# Patient Record
Sex: Male | Born: 1985 | Hispanic: Yes | Marital: Single | Smoking: Current some day smoker
Health system: Southern US, Community
[De-identification: ages and names within clinical notes are randomized; demographics above are authoritative.]

## PROBLEM LIST (undated history)

## (undated) DIAGNOSIS — Z8489 Family history of other specified conditions: Secondary | ICD-10-CM

## (undated) HISTORY — PX: APPENDECTOMY: SHX54

---

## 2019-03-17 ENCOUNTER — Other Ambulatory Visit: Payer: Self-pay

## 2019-03-17 ENCOUNTER — Emergency Department (HOSPITAL_COMMUNITY): Admission: EM | Admit: 2019-03-17 | Discharge: 2019-03-17 | Discharge status: Against Medical Advice | Payer: Self-pay

## 2019-03-17 NOTE — ED Notes (Signed)
Called for triage with no answer x1 

## 2019-03-17 NOTE — ED Notes (Signed)
Unable to locate patient multiple times by staff.  

## 2019-03-17 NOTE — ED Notes (Signed)
No answer x2 

## 2019-03-18 ENCOUNTER — Emergency Department (HOSPITAL_COMMUNITY): Payer: Self-pay

## 2019-03-18 ENCOUNTER — Emergency Department (HOSPITAL_COMMUNITY)
Admission: EM | Admit: 2019-03-18 | Discharge: 2019-03-18 | Disposition: A | Discharge status: Home / Self Care | Payer: Self-pay | Attending: Emergency Medicine | Admitting: Emergency Medicine

## 2019-03-18 ENCOUNTER — Encounter (HOSPITAL_COMMUNITY): Payer: Self-pay | Admitting: Emergency Medicine

## 2019-03-18 DIAGNOSIS — R509 Fever, unspecified: Secondary | ICD-10-CM | POA: Insufficient documentation

## 2019-03-18 DIAGNOSIS — U071 COVID-19: Secondary | ICD-10-CM | POA: Insufficient documentation

## 2019-03-18 LAB — CBC
HCT: 43.2 % (ref 39.0–52.0)
Hemoglobin: 14.4 g/dL (ref 13.0–17.0)
MCH: 29.8 pg (ref 26.0–34.0)
MCHC: 33.3 g/dL (ref 30.0–36.0)
MCV: 89.3 fL (ref 80.0–100.0)
Platelets: 148 10*3/uL — ABNORMAL LOW (ref 150–400)
RBC: 4.84 MIL/uL (ref 4.22–5.81)
RDW: 11.8 % (ref 11.5–15.5)
WBC: 4 10*3/uL (ref 4.0–10.5)
nRBC: 0 % (ref 0.0–0.2)

## 2019-03-18 LAB — BASIC METABOLIC PANEL
Anion gap: 13 (ref 5–15)
BUN: 6 mg/dL (ref 6–20)
CO2: 24 mmol/L (ref 22–32)
Calcium: 8.7 mg/dL — ABNORMAL LOW (ref 8.9–10.3)
Chloride: 98 mmol/L (ref 98–111)
Creatinine, Ser: 0.91 mg/dL (ref 0.61–1.24)
GFR calc Af Amer: >60 mL/min (ref >60)
GFR calc non Af Amer: >60 mL/min (ref >60)
Glucose, Bld: 146 mg/dL — ABNORMAL HIGH (ref 70–99)
Potassium: 3.9 mmol/L (ref 3.5–5.1)
Sodium: 135 mmol/L (ref 135–145)

## 2019-03-18 LAB — URINALYSIS, ROUTINE W REFLEX MICROSCOPIC
Bilirubin Urine: NEGATIVE
Glucose, UA: NEGATIVE mg/dL
Hgb urine dipstick: NEGATIVE
Ketones, ur: NEGATIVE mg/dL
Leukocytes,Ua: NEGATIVE
Nitrite: NEGATIVE
Protein, ur: NEGATIVE mg/dL
Specific Gravity, Urine: 1.003 — ABNORMAL LOW (ref 1.005–1.030)
pH: 7 (ref 5.0–8.0)

## 2019-03-18 MED ORDER — ONDANSETRON HCL 4 MG/2ML IJ SOLN
4.0000 mg | Freq: Once | INTRAMUSCULAR | Status: AC
  Administered 2019-03-18: 17:00:00 4 mg via INTRAVENOUS
  Filled 2019-03-18: qty 2

## 2019-03-18 MED ORDER — SODIUM CHLORIDE 0.9 % IV BOLUS
1000.0000 mL | Freq: Once | INTRAVENOUS | Status: AC
  Administered 2019-03-18: 1000 mL via INTRAVENOUS

## 2019-03-18 MED ORDER — KETOROLAC TROMETHAMINE 30 MG/ML IJ SOLN
30.0000 mg | Freq: Once | INTRAMUSCULAR | Status: AC
  Administered 2019-03-18: 30 mg via INTRAVENOUS
  Filled 2019-03-18: qty 1

## 2019-03-18 MED ORDER — ACETAMINOPHEN 500 MG PO TABS
1000.0000 mg | ORAL_TABLET | Freq: Once | ORAL | Status: AC
  Administered 2019-03-18: 1000 mg via ORAL
  Filled 2019-03-18: qty 2

## 2019-03-18 NOTE — ED Triage Notes (Signed)
Pt states he was here last night but the wait was too long. Endorses 2 weeks of generalized pain, chills and night sweats. Has been working outside for 2 works and not drinking water so states he may be dehydrated.

## 2019-03-18 NOTE — ED Notes (Signed)
Patient verbalizes understanding of discharge instructions . Opportunity for questions and answers were provided . Armband removed by staff ,Pt discharged from ED. W/C  offered at D/C  and Declined W/C at D/C and was escorted to lobby by RN.  

## 2019-03-18 NOTE — ED Provider Notes (Signed)
[facility] EMERGENCY DEPARTMENT Provider Note   CSN: [id number] Arrival date & time: 03/18/19  1303     History   Chief Complaint Chief Complaint  Patient presents with  . Generalized Body Aches    HPI Peter Beck is a 33 y.o. male who presents for evaluation of generalized body aches, myalgias, fever/chills, loss of appetite that has been ongoing for the last 2 weeks.  He states initially about 2 weeks ago, he felt like his tongue "felt weird." He states he had some numbness in his tongue and felt like he was not tasting his food.  He states that shortly after, he started developing subjective fever chills.  He states that he feels "achy all over."  He states that he has not had much of an appetite.  He also reports he has had some congestion, rhinorrhea.  He also reports some cough that is productive for sputum.  He did have one episode of vomiting today but otherwise has not had any vomiting.  He denies any difficulty breathing, chest pain, abdominal pain, urinary complaints, hematuria.  He does report that about a week and a half ago, he had contact with a person who was then found to be Covid positive.  No other known COVID-19 exposures.   The history is provided by the patient. No language interpreter was used (I offered patient language interpreter but wife stated that she would translate.).    History reviewed. No pertinent past medical history.  There are no active problems to display for this patient.   History reviewed. No pertinent surgical history.      Home Medications    Prior to Admission medications   Not on File    Family History No family history on file.  Social History Social History   Tobacco Use  . Smoking status: Not on file  Substance Use Topics  . Alcohol use: Not on file  . Drug use: Not on file     Allergies   Patient has no allergy information on record.   Review of Systems Review of Systems  Constitutional:  Positive for appetite change, chills and fever. Negative for diaphoresis.  Respiratory: Positive for cough. Negative for shortness of breath.   Cardiovascular: Negative for chest pain.  Gastrointestinal: Negative for abdominal pain, nausea and vomiting.  Genitourinary: Negative for dysuria and hematuria.  Musculoskeletal: Positive for myalgias.  Neurological: Negative for headaches.  All other systems reviewed and are negative.    Physical Exam Updated Vital Signs BP 104/62   Pulse 90   Temp 98.7 F (37.1 C) (Oral)   Resp 19   SpO2 96%   Physical Exam Vitals signs and nursing note reviewed.  Constitutional:      Appearance: Normal appearance. He is well-developed.  HENT:     Head: Normocephalic and atraumatic.     Mouth/Throat:     Comments: Posterior oropharynx is without any erythema, edema.  Uvula is midline.  Airways patent, phonation is intact. Eyes:     General: Lids are normal.     Conjunctiva/sclera: Conjunctivae normal.     Pupils: Pupils are equal, round, and reactive to light.  Neck:     Musculoskeletal: Full passive range of motion without pain.  Cardiovascular:     Rate and Rhythm: Normal rate and regular rhythm.     Pulses: Normal pulses.     Heart sounds: Normal heart sounds. No murmur. No friction rub. No gallop.   Pulmonary:  Effort: Pulmonary effort is normal.     Breath sounds: Normal breath sounds.     Comments: No evidence of respiratory distress.  Able to speak in full sentences without any difficulty. Abdominal:     Palpations: Abdomen is soft. Abdomen is not rigid.     Tenderness: There is no abdominal tenderness. There is no guarding.     Comments: Abdomen is soft, non-distended, non-tender. No rigidity, No guarding. No peritoneal signs.   Musculoskeletal: Normal range of motion.  Skin:    General: Skin is warm and dry.     Capillary Refill: Capillary refill takes less than 2 seconds.  Neurological:     Mental Status: He is alert and  oriented to person, place, and time.  Psychiatric:        Speech: Speech normal.      ED Treatments / Results  Labs (all labs ordered are listed, but only abnormal results are displayed) Labs Reviewed  BASIC METABOLIC PANEL - Abnormal; Notable for the following components:      Result Value   Glucose, Bld 146 (*)    Calcium 8.7 (*)    All other components within normal limits  CBC - Abnormal; Notable for the following components:   Platelets 148 (*)    All other components within normal limits  URINALYSIS, ROUTINE W REFLEX MICROSCOPIC - Abnormal; Notable for the following components:   Color, Urine STRAW (*)    Specific Gravity, Urine 1.003 (*)    All other components within normal limits  NOVEL CORONAVIRUS, NAA (HOSP ORDER, SEND-OUT TO REF LAB; TAT 18-24 HRS)    EKG None  Radiology Dg Chest Portable 1 View  Result Date: 03/18/2019 CLINICAL DATA:  New onset chest pain and shortness of breath EXAM: PORTABLE CHEST 1 VIEW COMPARISON:  None. FINDINGS: The heart size and mediastinal contours are within normal limits. Both lungs are clear. The visualized skeletal structures are unremarkable. IMPRESSION: No active disease. Electronically Signed   By: Abelardo Diesel M.D.   On: 03/18/2019 16:40    Procedures Procedures (including critical care time)  Medications Ordered in ED Medications  ondansetron (ZOFRAN) injection 4 mg (4 mg Intravenous Given 03/18/19 1656)  sodium chloride 0.9 % bolus 1,000 mL (0 mLs Intravenous Stopped 03/18/19 1917)  ketorolac (TORADOL) 30 MG/ML injection 30 mg (30 mg Intravenous Given 03/18/19 1800)  acetaminophen (TYLENOL) tablet 1,000 mg (1,000 mg Oral Given 03/18/19 1930)  sodium chloride 0.9 % bolus 1,000 mL (0 mLs Intravenous Stopped 03/18/19 2140)     Initial Impression / Assessment and Plan / ED Course  I have reviewed the triage vital signs and the nursing notes.  Pertinent labs & imaging results that were available during my care of the  patient were reviewed by me and considered in my medical decision making (see chart for details).        33 year old male who presents for evaluation of 1 week of generalized myalgias, fever/chills.  He reports that 2 weeks ago, he had some episodes of losing his taste as well as feeling that his tongue was numb.  He says numbness but when he describes to me it sounds more like he could not taste rather than his tongue being numb.  That has since gotten better but then he developed fevers, chills, generalized body aches.  On initial arrival, he was afebrile.  Vitals otherwise stable.  His repeat temperature had improved.   Consider viral process such as COVID-19.  His abdominal exam is benign  with no evidence of tenderness.  History/physical exam not concerning for appendicitis, diverticulitis, kidney stone.  Lungs clear to auscultation.  Doubt pneumonia.  We will plan to check basic labs, urine, chest x-ray to ensure no other acute abnormality.  X-ray negative for any acute effects etiology.  UA negative for any infectious etiology.  BMP is unremarkable.  CBC without any significant leukocytosis or anemia.  New.  Repeat evaluation.  His fever has returned.  He was not given any Tylenol initial ED arrival.  Patient given Tylenol, fluids.  Patient is walking the ED without difficulty.  He reports feeling better.  Repeat temperature is 98.7.  Vitals otherwise stable.  Patient states he is ready to go home.  Patient instructed to quarantine himself until his Covid test comes back. At this time, patient exhibits no emergent life-threatening condition that require further evaluation in ED or admission. Patient had ample opportunity for questions and discussion. All patient's questions were answered with full understanding. Strict return precautions discussed. Patient expresses understanding and agreement to plan.   Peter Beck was evaluated in Emergency Department on 03/18/2019 for the symptoms described in  the history of present illness. He was evaluated in the context of the global COVID-19 pandemic, which necessitated consideration that the patient might be at risk for infection with the SARS-CoV-2 virus that causes COVID-19. Institutional protocols and algorithms that pertain to the evaluation of patients at risk for COVID-19 are in a state of rapid change based on information released by regulatory bodies including the CDC and federal and state organizations. These policies and algorithms were followed during the patient's care in the ED.  Portions of this note were generated with Scientist, clinical (histocompatibility and immunogenetics)Dragon dictation software. Dictation errors may occur despite best attempts at proofreading.    Final Clinical Impressions(s) / ED Diagnoses   Final diagnoses:  Fever, unspecified fever cause    ED Discharge Orders    None       Rosana HoesLayden, Lindsey A, PA-C 03/18/19 2211    Tegeler, Canary Brimhristopher J, MD 03/19/19 [phone]

## 2019-03-18 NOTE — Discharge Instructions (Signed)
You have been tested for COVID.  Your test will come back in about 24 to 48 hours.  You should quarantine until the test comes back.  If you are positive, you will need to additional quarantine for 2 more weeks.  Make sure you are staying hydrated and drinking plenty of fluids.  Return to emergency department for any trouble breathing, vomiting, inability eat or drink or any other worsening or concerning symptoms.      Person Under Monitoring Name: Peter Beck  Location: [address] [place] [address]   Infection Prevention Recommendations for Individuals Confirmed to have, or Being Evaluated for, 2019 Novel Coronavirus (COVID-19) Infection Who Receive Care at Home  Individuals who are confirmed to have, or are being evaluated for, COVID-19 should follow the prevention steps below until a healthcare provider or local or state health department says they can return to normal activities.  Stay home except to get medical care You should restrict activities outside your home, except for getting medical care. Do not go to work, school, or public areas, and do not use public transportation or taxis.  Call ahead before visiting your doctor Before your medical appointment, call the healthcare provider and tell them that you have, or are being evaluated for, COVID-19 infection. This will help the healthcare providers office take steps to keep other people from getting infected. Ask your healthcare provider to call the local or state health department.  Monitor your symptoms Seek prompt medical attention if your illness is worsening (e.g., difficulty breathing). Before going to your medical appointment, call the healthcare provider and tell them that you have, or are being evaluated for, COVID-19 infection. Ask your healthcare provider to call the local or state health department.  Wear a facemask You should wear a facemask that covers your nose and mouth when you are in the  same room with other people and when you visit a healthcare provider. People who live with or visit you should also wear a facemask while they are in the same room with you.  Separate yourself from other people in your home As much as possible, you should stay in a different room from other people in your home. Also, you should use a separate bathroom, if available.  Avoid sharing household items You should not share dishes, drinking glasses, cups, eating utensils, towels, bedding, or other items with other people in your home. After using these items, you should wash them thoroughly with soap and water.  Cover your coughs and sneezes Cover your mouth and nose with a tissue when you cough or sneeze, or you can cough or sneeze into your sleeve. Throw used tissues in a lined trash can, and immediately wash your hands with soap and water for at least 20 seconds or use an alcohol-based hand rub.  Wash your [facility] your hands often and thoroughly with soap and water for at least 20 seconds. You can use an alcohol-based hand sanitizer if soap and water are not available and if your hands are not visibly dirty. Avoid touching your eyes, nose, and mouth with unwashed hands.   Prevention Steps for Caregivers and Household Members of Individuals Confirmed to have, or Being Evaluated for, COVID-19 Infection Being Cared for in the Home  If you live with, or provide care at home for, a person confirmed to have, or being evaluated for, COVID-19 infection please follow these guidelines to prevent infection:  Follow healthcare providers instructions Make sure that you understand  and can help the patient follow any healthcare provider instructions for all care.  Provide for the patients basic needs You should help the patient with basic needs in the home and provide support for getting groceries, prescriptions, and other personal needs.  Monitor the patients symptoms If they are getting  sicker, call his or her medical provider and tell them that the patient has, or is being evaluated for, COVID-19 infection. This will help the healthcare providers office take steps to keep other people from getting infected. Ask the healthcare provider to call the local or state health department.  Limit the number of people who have contact with the patient  If possible, have only one caregiver for the patient.  Other household members should stay in another home or place of residence. If this is not possible, they should stay  in another room, or be separated from the patient as much as possible. Use a separate bathroom, if available.  Restrict visitors who do not have an essential need to be in the home.  Keep older adults, very young children, and other sick people away from the patient Keep older adults, very young children, and those who have compromised immune systems or chronic health conditions away from the patient. This includes people with chronic heart, lung, or kidney conditions, diabetes, and cancer.  Ensure good ventilation Make sure that shared spaces in the home have good air flow, such as from an air conditioner or an opened window, weather permitting.  Wash your hands often  Wash your hands often and thoroughly with soap and water for at least 20 seconds. You can use an alcohol based hand sanitizer if soap and water are not available and if your hands are not visibly dirty.  Avoid touching your eyes, nose, and mouth with unwashed hands.  Use disposable paper towels to dry your hands. If not available, use dedicated cloth towels and replace them when they become wet.  Wear a facemask and gloves  Wear a disposable facemask at all times in the room and gloves when you touch or have contact with the patients blood, body fluids, and/or secretions or excretions, such as sweat, saliva, sputum, nasal mucus, vomit, urine, or feces.  Ensure the mask fits over your nose and  mouth tightly, and do not touch it during use.  Throw out disposable facemasks and gloves after using them. Do not reuse.  Wash your hands immediately after removing your facemask and gloves.  If your personal clothing becomes contaminated, carefully remove clothing and launder. Wash your hands after handling contaminated clothing.  Place all used disposable facemasks, gloves, and other waste in a lined container before disposing them with other household waste.  Remove gloves and wash your hands immediately after handling these items.  Do not share dishes, glasses, or other household items with the patient  Avoid sharing household items. You should not share dishes, drinking glasses, cups, eating utensils, towels, bedding, or other items with a patient who is confirmed to have, or being evaluated for, COVID-19 infection.  After the person uses these items, you should wash them thoroughly with soap and water.  Wash laundry thoroughly  Immediately remove and wash clothes or bedding that have blood, body fluids, and/or secretions or excretions, such as sweat, saliva, sputum, nasal mucus, vomit, urine, or feces, on them.  Wear gloves when handling laundry from the patient.  Read and follow directions on labels of laundry or clothing items and detergent. In general, wash and dry  with the warmest temperatures recommended on the label.  Clean all areas the individual has used often  Clean all touchable surfaces, such as counters, tabletops, doorknobs, bathroom fixtures, toilets, phones, keyboards, tablets, and bedside tables, every day. Also, clean any surfaces that may have blood, body fluids, and/or secretions or excretions on them.  Wear gloves when cleaning surfaces the patient has come in contact with.  Use a diluted bleach solution (e.g., dilute bleach with 1 part bleach and 10 parts water) or a household disinfectant with a label that says EPA-registered for coronaviruses. To make a  bleach solution at home, add 1 tablespoon of bleach to 1 quart (4 cups) of water. For a larger supply, add  cup of bleach to 1 gallon (16 cups) of water.  Read labels of cleaning products and follow recommendations provided on product labels. Labels contain instructions for safe and effective use of the cleaning product including precautions you should take when applying the product, such as wearing gloves or eye protection and making sure you have good ventilation during use of the product.  Remove gloves and wash hands immediately after cleaning.  Monitor yourself for signs and symptoms of illness Caregivers and household members are considered close contacts, should monitor their health, and will be asked to limit movement outside of the home to the extent possible. Follow the monitoring steps for close contacts listed on the symptom monitoring form.   ? If you have additional questions, contact your local health department or call the epidemiologist on call at [phone] (available 24/7). ? This guidance is subject to change. For the most up-to-date guidance from [facility], please refer to their website: TripMetro.huhttps://www.cdc.gov/coronavirus/2019-ncov/hcp/guidance-prevent-spread.html

## 2019-03-23 LAB — NOVEL CORONAVIRUS, NAA (HOSP ORDER, SEND-OUT TO REF LAB; TAT 18-24 HRS): SARS-CoV-2, NAA: DETECTED — AB

## 2019-08-11 ENCOUNTER — Emergency Department (HOSPITAL_BASED_OUTPATIENT_CLINIC_OR_DEPARTMENT_OTHER)
Admission: EM | Admit: 2019-08-11 | Discharge: 2019-08-11 | Disposition: A | Discharge status: Home / Self Care | Payer: Self-pay | Attending: Emergency Medicine | Admitting: Emergency Medicine

## 2019-08-11 ENCOUNTER — Encounter (HOSPITAL_BASED_OUTPATIENT_CLINIC_OR_DEPARTMENT_OTHER): Payer: Self-pay | Admitting: *Deleted

## 2019-08-11 ENCOUNTER — Other Ambulatory Visit: Payer: Self-pay

## 2019-08-11 ENCOUNTER — Emergency Department (HOSPITAL_BASED_OUTPATIENT_CLINIC_OR_DEPARTMENT_OTHER): Payer: Self-pay

## 2019-08-11 DIAGNOSIS — R2242 Localized swelling, mass and lump, left lower limb: Secondary | ICD-10-CM | POA: Insufficient documentation

## 2019-08-11 DIAGNOSIS — F17210 Nicotine dependence, cigarettes, uncomplicated: Secondary | ICD-10-CM | POA: Insufficient documentation

## 2019-08-11 DIAGNOSIS — M25469 Effusion, unspecified knee: Secondary | ICD-10-CM

## 2019-08-11 DIAGNOSIS — W19XXXA Unspecified fall, initial encounter: Secondary | ICD-10-CM

## 2019-08-11 MED ORDER — HYDROCODONE-ACETAMINOPHEN 5-325 MG PO TABS
1.0000 | ORAL_TABLET | Freq: Once | ORAL | Status: DC
  Filled 2019-08-11: qty 1

## 2019-08-11 MED ORDER — HYDROCODONE-ACETAMINOPHEN 5-325 MG PO TABS: 1.0000 | ORAL_TABLET | Freq: Four times a day (QID) | ORAL | 0 refills | Status: AC | PRN

## 2019-08-11 NOTE — ED Notes (Signed)
Interpreter # [phone] was used.

## 2019-08-11 NOTE — ED Triage Notes (Signed)
Left lower leg pain and injury. He was working on his house and fell off the first floor and landed on his feet.

## 2019-08-11 NOTE — ED Provider Notes (Signed)
[facility] EMERGENCY DEPARTMENT Provider Note   CSN: [id number] Arrival date & time: 08/11/19  2053     History Chief Complaint  Patient presents with  . Leg Pain    Peter Beck is a 34 y.o. male.  Patient is a 34 year old male with no past medical history presenting to the emergency department for left knee injury.  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  Reports that he landed on his left leg.  Reports pain and swelling to the left knee.  Did not hit his head or pass out.  No other injuries.        History reviewed. No pertinent past medical history.  There are no problems to display for this patient.   Past Surgical History:  Procedure Laterality Date  . APPENDECTOMY         No family history on file.  Social History   Tobacco Use  . Smoking status: Current Every Day Smoker  . Smokeless tobacco: Never Used  Substance Use Topics  . Alcohol use: Yes  . Drug use: Never    Home Medications Prior to Admission medications   Medication Sig Start Date End Date Taking? Authorizing Provider  HYDROcodone-acetaminophen (NORCO/VICODIN) 5-325 MG tablet Take 1 tablet by mouth every 6 (six) hours as needed for up to 2 days for severe pain. 08/11/19 08/13/19  Arlyn Dunning, PA-C    Allergies    Patient has no known allergies.  Review of Systems   Review of Systems  Musculoskeletal: Positive for arthralgias, gait problem and joint swelling. Negative for back pain and neck pain.  Neurological: Negative for dizziness, syncope, weakness and headaches.    Physical Exam Updated Vital Signs BP (!) 135/92   Pulse 99   Temp 98.2 F (36.8 C) (Oral)   Resp 20   Ht 5\' 7"  (1.702 m)   Wt 80.7 kg   SpO2 99%   BMI 27.88 kg/m   Physical Exam Vitals and nursing note reviewed.  Constitutional:      General: He is not in acute distress.    Appearance: Normal appearance. He is not ill-appearing,  toxic-appearing or diaphoretic.  HENT:     Head: Normocephalic.  Eyes:     Conjunctiva/sclera: Conjunctivae normal.  Pulmonary:     Effort: Pulmonary effort is normal.  Musculoskeletal:     Left knee: Swelling and effusion present. No deformity. Decreased range of motion. Tenderness present.     Comments: No spinal muscular or bony tenderness.  Medial left knee swelling with tenderness to palpation and ecchymosis.  Ecchymosis over the left mid tibia.  Normal left ankle and foot.  Skin:    General: Skin is dry.  Neurological:     Mental Status: He is alert.  Psychiatric:        Mood and Affect: Mood normal.     ED Results / Procedures / Treatments   Labs (all labs ordered are listed, but only abnormal results are displayed) Labs Reviewed - No data to display  EKG None  Radiology DG Tibia/Fibula Left  Result Date: 08/11/2019 CLINICAL DATA:  Initial evaluation for acute trauma, fall. EXAM: LEFT TIBIA AND FIBULA - 2 VIEW COMPARISON:  None. FINDINGS: There is no evidence of fracture or other focal bone lesions. Soft tissues are unremarkable. IMPRESSION: Negative. Electronically Signed   By: 08/13/2019 M.D.   On: 08/11/2019 21:35   DG Knee Complete 4 Views Left  Result Date: 08/11/2019 CLINICAL DATA:  Initial evaluation for acute trauma, fall. EXAM: LEFT KNEE - COMPLETE 4+ VIEW COMPARISON:  None. FINDINGS: No acute fracture dislocation. Small joint effusion noted within the suprapatellar recess. 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 Osseous mineralization normal. No soft tissue injury. IMPRESSION: 1. No acute osseous abnormality. 2. Small joint effusion within the suprapatellar recess. Electronically Signed   By: Jeannine Boga M.D.   On: 08/11/2019 21:34    Procedures Procedures (including critical care time)  Medications Ordered in ED Medications  HYDROcodone-acetaminophen (NORCO/VICODIN) 5-325 MG per tablet 1 tablet (1 tablet Oral Refused  08/11/19 2203)    ED Course  I have reviewed the triage vital signs and the nursing notes.  Pertinent labs & imaging results that were available during my care of the patient were reviewed by me and considered in my medical decision making (see chart for details).  Clinical Course as of Aug 11 2226  Mon Aug 11, 2019  2148 Patient with mechanical fall from first story.  Has significant pain and swelling to the left knee   [KM]  2228 Patient with fall and left knee pain.  No other injuries.  Significant pain and swelling of the medial left knee on exam.  X-ray are reassuring.  Suspect ligamentous issue so patient was placed in knee immobilizer and on crutches and advised close follow-up with orthopedics.   [KM]    Clinical Course User Index [KM] Kristine Royal   MDM Rules/Calculators/A&P                     Based on review of vitals, medical screening exam, lab work and/or imaging, there does not appear to be an acute, emergent etiology for the patient's symptoms. Counseled pt on good return precautions and encouraged both PCP and ED follow-up as needed.  Prior to discharge, I also discussed incidental imaging findings with patient in detail and advised appropriate, recommended follow-up in detail.  Clinical Impression: 1. Fall, initial encounter   2. Knee swelling     Disposition: Discharge  Prior to providing a prescription for a controlled substance, I independently reviewed the patient's recent prescription history on the [facility]. The patient had no recent or regular prescriptions and was deemed appropriate for a brief, less than 3 day prescription of narcotic for acute analgesia.  This note was prepared with assistance of Systems analyst. Occasional wrong-word or sound-a-like substitutions may have occurred due to the inherent limitations of voice recognition software.  Final Clinical Impression(s) / ED  Diagnoses Final diagnoses:  Fall, initial encounter  Knee swelling    Rx / DC Orders ED Discharge Orders         Ordered    HYDROcodone-acetaminophen (NORCO/VICODIN) 5-325 MG tablet  Every 6 hours PRN     08/11/19 2206           Kristine Royal 08/11/19 2228    Fredia Sorrow, MD 08/13/19 1650

## 2019-08-11 NOTE — Discharge Instructions (Signed)
Please call ortho and schedule your appointment for further evaluation.

## 2019-08-13 ENCOUNTER — Encounter: Payer: Self-pay | Admitting: Orthopaedic Surgery

## 2019-08-13 ENCOUNTER — Ambulatory Visit (INDEPENDENT_AMBULATORY_CARE_PROVIDER_SITE_OTHER): Payer: Self-pay | Admitting: Orthopaedic Surgery

## 2019-08-13 ENCOUNTER — Other Ambulatory Visit: Payer: Self-pay

## 2019-08-13 DIAGNOSIS — M25562 Pain in left knee: Secondary | ICD-10-CM

## 2019-08-13 MED ORDER — DICLOFENAC SODIUM 75 MG PO TBEC: 75.0000 mg | DELAYED_RELEASE_TABLET | Freq: Two times a day (BID) | ORAL | 2 refills | Status: AC

## 2019-08-13 MED ORDER — TRAMADOL HCL 50 MG PO TABS: 50.0000 mg | ORAL_TABLET | Freq: Four times a day (QID) | ORAL | 2 refills | Status: AC | PRN

## 2019-08-13 NOTE — Progress Notes (Signed)
   Office Visit Note   Patient: Peter Beck           Date of Birth: 10/28/1985           MRN: [id number] Visit Date: 08/13/2019              Requested by: No referring provider defined for this encounter. PCP: Patient, No Pcp Per   Assessment & Plan: Visit Diagnoses:  1. Acute pain of left knee     Plan: Impression is left knee probable ACL and possible MCL tears.  He will remain in a knee immobilizer weightbearing as tolerated.  Elevate and ice for pain and swelling.  Will obtain an MRI at this point to further assess for structural abnormalities.  He will follow-up with [place] once has been completed.  I will call in Voltaren as well as tramadol to take as needed for pain.  Call with concerns or questions in meantime.  Follow-Up Instructions: Return for after MRI.   Orders:  Orders Placed This Encounter  Procedures  . MR Knee Left w/o contrast   No orders of the defined types were placed in this encounter.     Procedures: No procedures performed   Clinical Data: No additional findings.   Subjective: Chief Complaint  Patient presents with  . Left Leg - Pain    HPI patient is a 34 year old Spanish-speaking gentleman who comes in today with an interpreter.  He notes that Monday, 08/11/2019 he fell from approximately 10 feet landing on his feet and injuring his left knee.  He was seen in the ED where x-rays were obtained.  These were negative for fracture.  He was given a knee immobilizer and crutches.  He comes in today for further evaluation treatment recommendation.  The pain he has is primarily to the anteromedial aspect of the left knee.  Worse with any sort of pivoting motion or ambulation.  He does note significant giving way.  He has been taking an NSAID without significant relief of symptoms.  No numbness, tingling or burning.  No previous injury to the left knee.  Review of Systems as detailed in HPI.  All others reviewed and are negative.   Objective: Vital  Signs: There were no vitals taken for this visit.  Physical Exam well-developed well-nourished gentleman in no acute distress.  Alert and oriented x3.  Ortho Exam examination of the left knee reveals a 2+ effusion.  Range of motion 30 to 90 degrees.  Significant tenderness medial joint line.  No tenderness lateral joint line.  He does have a positive anterior drawer.  He has pain with valgus stress and slight laxity.  Calf is soft nontender.  He is neurovascular intact distally.  Specialty Comments:  No specialty comments available.  Imaging: No new imaging   PMFS History: There are no problems to display for this patient.  History reviewed. No pertinent past medical history.  History reviewed. No pertinent family history.  Past Surgical History:  Procedure Laterality Date  . APPENDECTOMY     Social History   Occupational History  . Not on file  Tobacco Use  . Smoking status: Current Every Day Smoker  . Smokeless tobacco: Never Used  Substance and Sexual Activity  . Alcohol use: Yes  . Drug use: Never  . Sexual activity: Not on file

## 2019-08-14 ENCOUNTER — Ambulatory Visit (HOSPITAL_COMMUNITY)
Admission: RE | Admit: 2019-08-14 | Discharge: 2019-08-14 | Disposition: A | Discharge status: Home / Self Care | Payer: Self-pay | Source: Ambulatory Visit | Attending: Orthopaedic Surgery | Admitting: Orthopaedic Surgery

## 2019-08-14 DIAGNOSIS — M25562 Pain in left knee: Secondary | ICD-10-CM | POA: Insufficient documentation

## 2019-08-14 NOTE — Progress Notes (Signed)
Needs f/u appt.  Thanks.

## 2019-08-19 ENCOUNTER — Other Ambulatory Visit: Payer: Self-pay

## 2019-08-19 ENCOUNTER — Encounter: Payer: Self-pay | Admitting: Physician Assistant

## 2019-08-19 ENCOUNTER — Ambulatory Visit (INDEPENDENT_AMBULATORY_CARE_PROVIDER_SITE_OTHER): Payer: Self-pay | Admitting: Orthopaedic Surgery

## 2019-08-19 DIAGNOSIS — S83412D Sprain of medial collateral ligament of left knee, subsequent encounter: Secondary | ICD-10-CM

## 2019-08-19 DIAGNOSIS — S83512D Sprain of anterior cruciate ligament of left knee, subsequent encounter: Secondary | ICD-10-CM

## 2019-08-19 MED ORDER — BUPIVACAINE HCL 0.25 % IJ SOLN
2.0000 mL | INTRAMUSCULAR | Status: AC | PRN
  Administered 2019-08-19: 2 mL via INTRA_ARTICULAR

## 2019-08-19 MED ORDER — LIDOCAINE HCL 1 % IJ SOLN
2.0000 mL | INTRAMUSCULAR | Status: AC | PRN
  Administered 2019-08-19: 2 mL

## 2019-08-19 NOTE — Progress Notes (Signed)
   Office Visit Note   Patient: Peter Beck           Date of Birth: 10/28/1985           MRN: [id number] Visit Date: 08/19/2019              Requested by: No referring provider defined for this encounter. PCP: Patient, No Pcp Per   Assessment & Plan: Visit Diagnoses:  1. Rupture of anterior cruciate ligament of left knee, subsequent encounter   2. Tear of MCL (medial collateral ligament) of knee, left, subsequent encounter     Plan: Impression is complete ACL tear, grade 1 MCL tear small fractures to the lateral femoral condyle and lateral tibial plateau and possible posterior lateral corner injury.  Today I aspirated his left knee to help facilitate with range of motion.  We have discussed treatment options to include ACL reconstruction.  I did discuss with him the need to improve his range of motion before proceeding with this.  In the meantime, he will continue weightbearing as tolerated in his knee immobilizer using crutches.  He will follow-up with [place] in 1 to 2 weeks time for repeat evaluation for improvement in range of motion.  Call with concerns or questions in meantime. Follow-Up Instructions: Return in about 1 week (around 08/26/2019).   Orders:  Orders Placed This Encounter  Procedures  . Large Joint Inj: L knee   No orders of the defined types were placed in this encounter.     Procedures: Large Joint Inj: L knee on 08/19/2019 4:33 PM Indications: pain Details: 22 G needle, anterolateral approach Medications: 2 mL lidocaine 1 %; 2 mL bupivacaine 0.25 %      Clinical Data: No additional findings.   Subjective: Chief Complaint  Patient presents with  . Left Knee - Pain, Follow-up    HPI patient is a 34 year old gentleman who comes in today to discuss MRI results of the left knee.  Left knee injury occurred a little over 1 week ago.  Subsequent MRI ordered which was done on 08/14/2019 shows a complete ACL tear, grade 1 MCL tear small fractures to the lateral  femoral condyle and lateral tibial plateau and possible posterior lateral corner injury.  This was discussed today with the patient.  He has been weightbearing as tolerated in a knee immobilizer utilizing crutches.  He has been taking pain medication as needed which does seem to help.     Objective: Vital Signs: There were no vitals taken for this visit.    Ortho Exam stable left knee exam with 1+ knee effusion  Specialty Comments:  No specialty comments available.  Imaging: No new imaging   PMFS History: There are no problems to display for this patient.  History reviewed. No pertinent past medical history.  History reviewed. No pertinent family history.  Past Surgical History:  Procedure Laterality Date  . APPENDECTOMY     Social History   Occupational History  . Not on file  Tobacco Use  . Smoking status: Current Every Day Smoker  . Smokeless tobacco: Never Used  Substance and Sexual Activity  . Alcohol use: Yes  . Drug use: Never  . Sexual activity: Not on file

## 2019-08-27 ENCOUNTER — Other Ambulatory Visit: Payer: Self-pay

## 2019-08-27 ENCOUNTER — Ambulatory Visit (INDEPENDENT_AMBULATORY_CARE_PROVIDER_SITE_OTHER): Payer: Self-pay | Admitting: Orthopaedic Surgery

## 2019-08-27 ENCOUNTER — Encounter: Payer: Self-pay | Admitting: Orthopaedic Surgery

## 2019-08-27 VITALS — Ht 67.0 in | Wt 178.0 lb

## 2019-08-27 DIAGNOSIS — S83512D Sprain of anterior cruciate ligament of left knee, subsequent encounter: Secondary | ICD-10-CM

## 2019-08-27 DIAGNOSIS — S83512A Sprain of anterior cruciate ligament of left knee, initial encounter: Secondary | ICD-10-CM | POA: Insufficient documentation

## 2019-08-27 NOTE — Progress Notes (Signed)
   Office Visit Note   Patient: Peter Beck           Date of Birth: 10/28/1985           MRN: [id number] Visit Date: 08/27/2019              Requested by: No referring provider defined for this encounter. PCP: Patient, No Pcp Per   Assessment & Plan: Visit Diagnoses:  1. Rupture of anterior cruciate ligament of left knee, subsequent encounter     Plan: My impression is left ACL rupture.  Again we discussed the details of the surgery at length through language interpreter.  Based on his active lifestyle and physically demanding job I have recommended ACL reconstruction.  We discussed the benefits and risks of autograft versus allograft, he has elected to go with allograft.  Questions encouraged and answered. Total face to face encounter time was greater than 25 minutes and over half of this time was spent in counseling and/or coordination of care.  Follow-Up Instructions: Return for 1 week postop visit.   Orders:  No orders of the defined types were placed in this encounter.  No orders of the defined types were placed in this encounter.     Procedures: No procedures performed   Clinical Data: No additional findings.   Subjective: Chief Complaint  Patient presents with  . Left Knee - Follow-up    Mattew returns today for follow-up of left ACL injury.  He is feeling much better since we aspirated the bloody effusion.  He continues to have instability and trouble with walking on uneven surfaces.  He has been ambulating with crutches and left knee immobilizer.  He is a very active gentleman and they have 3 sons and he does a lot of physical activity with them.  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   Review of Systems   Objective: Vital Signs: Ht 5\' 7"  (1.702 m)   Wt 178 lb (80.7 kg)   BMI 27.88 kg/m   Physical Exam  Ortho Exam Left knee shows a smaller joint effusion.  Range of motion is up to 105 degrees.  Positive Lachman  test.  Negative dial test. Specialty Comments:  No specialty comments available.  Imaging: No results found.   PMFS History: Patient Active Problem List   Diagnosis Date Noted  . Left anterior cruciate ligament tear 08/27/2019   No past medical history on file.  No family history on file.  Past Surgical History:  Procedure Laterality Date  . APPENDECTOMY     Social History   Occupational History  . Not on file  Tobacco Use  . Smoking status: Current Every Day Smoker  . Smokeless tobacco: Never Used  Substance and Sexual Activity  . Alcohol use: Yes  . Drug use: Never  . Sexual activity: Not on file

## 2019-09-02 ENCOUNTER — Other Ambulatory Visit: Payer: Self-pay

## 2019-09-02 ENCOUNTER — Encounter (HOSPITAL_BASED_OUTPATIENT_CLINIC_OR_DEPARTMENT_OTHER): Payer: Self-pay | Admitting: Orthopaedic Surgery
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
## 2019-09-06 ENCOUNTER — Other Ambulatory Visit (HOSPITAL_COMMUNITY)
Admission: RE | Admit: 2019-09-06 | Discharge: 2019-09-06 | Disposition: A | Discharge status: Home / Self Care | Payer: Self-pay | Source: Ambulatory Visit | Attending: Orthopaedic Surgery | Admitting: Orthopaedic Surgery

## 2019-09-06 DIAGNOSIS — Z01812 Encounter for preprocedural laboratory examination: Secondary | ICD-10-CM | POA: Insufficient documentation

## 2019-09-06 DIAGNOSIS — Z20822 Contact with and (suspected) exposure to covid-19: Secondary | ICD-10-CM | POA: Insufficient documentation

## 2019-09-06 LAB — SARS CORONAVIRUS 2 (TAT 6-24 HRS): SARS Coronavirus 2: NEGATIVE

## 2019-09-08 NOTE — Progress Notes (Signed)

## 2019-09-09 NOTE — Anesthesia Preprocedure Evaluation (Addendum)
Anesthesia Evaluation  Patient identified by MRN, date of birth, ID band Patient awake    Reviewed: Allergy & Precautions, H&P , NPO status , Patient's Chart, lab work & pertinent test results  History of Anesthesia Complications (+) PROLONGED EMERGENCE, Family history of anesthesia reaction and history of anesthetic complications  Airway Mallampati: II  TM Distance: >3 FB Neck ROM: Full    Dental no notable dental hx. (+) Teeth Intact, Dental Advisory Given   Pulmonary neg pulmonary ROS, Current Smoker and Patient abstained from smoking.,    Pulmonary exam normal breath sounds clear to auscultation       Cardiovascular Exercise Tolerance: Good negative cardio ROS Normal cardiovascular exam Rhythm:Regular Rate:Normal     Neuro/Psych negative neurological ROS  negative psych ROS   GI/Hepatic negative GI ROS, Neg liver ROS,   Endo/Other  negative endocrine ROS  Renal/GU negative Renal ROS  negative genitourinary   Musculoskeletal negative musculoskeletal ROS (+)   Abdominal   Peds negative pediatric ROS (+)  Hematology negative hematology ROS (+)   Anesthesia Other Findings   Reproductive/Obstetrics negative OB ROS                            Anesthesia Physical Anesthesia Plan  ASA: II  Anesthesia Plan: General   Post-op Pain Management: GA combined w/ Regional for post-op pain   Induction:   PONV Risk Score and Plan: 2 and Ondansetron, Dexamethasone and Treatment may vary due to age or medical condition  Airway Management Planned: Oral ETT and LMA  Additional Equipment:   Intra-op Plan:   Post-operative Plan: Extubation in OR  Informed Consent: I have reviewed the patients History and Physical, chart, labs and discussed the procedure including the risks, benefits and alternatives for the proposed anesthesia with the patient or authorized representative who has indicated  his/her understanding and acceptance.     Dental advisory given  Plan Discussed with: Anesthesiologist and CRNA  Anesthesia Plan Comments: (Discussed both nerve block for pain relief post-op and GA; including NV, sore throat, dental injury, and pulmonary complications)        Anesthesia Quick Evaluation

## 2019-09-10 ENCOUNTER — Encounter (HOSPITAL_BASED_OUTPATIENT_CLINIC_OR_DEPARTMENT_OTHER): Payer: Self-pay | Admitting: Orthopaedic Surgery

## 2019-09-10 ENCOUNTER — Other Ambulatory Visit: Payer: Self-pay

## 2019-09-10 ENCOUNTER — Ambulatory Visit (HOSPITAL_BASED_OUTPATIENT_CLINIC_OR_DEPARTMENT_OTHER): Payer: Self-pay | Admitting: Anesthesiology

## 2019-09-10 ENCOUNTER — Ambulatory Visit (HOSPITAL_BASED_OUTPATIENT_CLINIC_OR_DEPARTMENT_OTHER)
Admission: RE | Admit: 2019-09-10 | Discharge: 2019-09-10 | Disposition: A | Discharge status: Home / Self Care | Payer: Self-pay | Attending: Orthopaedic Surgery | Admitting: Orthopaedic Surgery

## 2019-09-10 ENCOUNTER — Other Ambulatory Visit: Payer: Self-pay | Admitting: Physician Assistant

## 2019-09-10 ENCOUNTER — Encounter (HOSPITAL_BASED_OUTPATIENT_CLINIC_OR_DEPARTMENT_OTHER)
Admission: RE | Disposition: A | Discharge status: Home / Self Care | Payer: Self-pay | Source: Home / Self Care | Attending: Orthopaedic Surgery

## 2019-09-10 DIAGNOSIS — Z791 Long term (current) use of non-steroidal anti-inflammatories (NSAID): Secondary | ICD-10-CM | POA: Insufficient documentation

## 2019-09-10 DIAGNOSIS — S83512A Sprain of anterior cruciate ligament of left knee, initial encounter: Secondary | ICD-10-CM | POA: Insufficient documentation

## 2019-09-10 DIAGNOSIS — Y939 Activity, unspecified: Secondary | ICD-10-CM | POA: Insufficient documentation

## 2019-09-10 DIAGNOSIS — X58XXXA Exposure to other specified factors, initial encounter: Secondary | ICD-10-CM | POA: Insufficient documentation

## 2019-09-10 DIAGNOSIS — E739 Lactose intolerance, unspecified: Secondary | ICD-10-CM | POA: Insufficient documentation

## 2019-09-10 DIAGNOSIS — F17200 Nicotine dependence, unspecified, uncomplicated: Secondary | ICD-10-CM | POA: Insufficient documentation

## 2019-09-10 HISTORY — PX: KNEE ARTHROSCOPY WITH ANTERIOR CRUCIATE LIGAMENT (ACL) REPAIR: SHX5644

## 2019-09-10 HISTORY — DX: Family history of other specified conditions: Z84.89

## 2019-09-10 SURGERY — KNEE ARTHROSCOPY WITH ANTERIOR CRUCIATE LIGAMENT (ACL) REPAIR
Anesthesia: General | Site: Knee | Laterality: Left

## 2019-09-10 MED ORDER — ONDANSETRON HCL 4 MG/2ML IJ SOLN
4.0000 mg | Freq: Once | INTRAMUSCULAR | Status: AC | PRN
  Administered 2019-09-10: 12:00:00 4 mg via INTRAVENOUS

## 2019-09-10 MED ORDER — OXYCODONE-ACETAMINOPHEN 7.5-325 MG PO TABS: ORAL_TABLET | ORAL | 0 refills | Status: AC

## 2019-09-10 MED ORDER — METHOCARBAMOL 750 MG PO TABS: 750.0000 mg | ORAL_TABLET | Freq: Two times a day (BID) | ORAL | 3 refills | Status: AC | PRN

## 2019-09-10 MED ORDER — MEPERIDINE HCL 25 MG/ML IJ SOLN: 6.2500 mg | INTRAMUSCULAR | Status: DC | PRN

## 2019-09-10 MED ORDER — ONDANSETRON HCL 4 MG PO TABS: 4.0000 mg | ORAL_TABLET | Freq: Three times a day (TID) | ORAL | 0 refills | Status: DC | PRN

## 2019-09-10 MED ORDER — FENTANYL CITRATE (PF) 100 MCG/2ML IJ SOLN
INTRAMUSCULAR | Status: AC
  Filled 2019-09-10: qty 2

## 2019-09-10 MED ORDER — METHOCARBAMOL 750 MG PO TABS: 750.0000 mg | ORAL_TABLET | Freq: Two times a day (BID) | ORAL | 3 refills | Status: DC | PRN

## 2019-09-10 MED ORDER — FENTANYL CITRATE (PF) 250 MCG/5ML IJ SOLN
INTRAMUSCULAR | Status: DC | PRN
  Administered 2019-09-10 (×4): 25 ug via INTRAVENOUS

## 2019-09-10 MED ORDER — OXYCODONE HCL 5 MG/5ML PO SOLN: 5.0000 mg | Freq: Once | ORAL | Status: AC | PRN

## 2019-09-10 MED ORDER — OXYCODONE HCL 5 MG PO TABS
ORAL_TABLET | ORAL | Status: AC
  Filled 2019-09-10: qty 1

## 2019-09-10 MED ORDER — CEFAZOLIN SODIUM-DEXTROSE 2-4 GM/100ML-% IV SOLN
2.0000 g | INTRAVENOUS | Status: AC
  Administered 2019-09-10: 09:00:00 2 g via INTRAVENOUS

## 2019-09-10 MED ORDER — ONDANSETRON HCL 4 MG PO TABS: 4.0000 mg | ORAL_TABLET | Freq: Three times a day (TID) | ORAL | 0 refills | Status: AC | PRN

## 2019-09-10 MED ORDER — CEFAZOLIN SODIUM-DEXTROSE 2-4 GM/100ML-% IV SOLN
INTRAVENOUS | Status: AC
  Filled 2019-09-10: qty 100

## 2019-09-10 MED ORDER — ONDANSETRON HCL 4 MG/2ML IJ SOLN
INTRAMUSCULAR | Status: DC | PRN
  Administered 2019-09-10: 4 mg via INTRAVENOUS

## 2019-09-10 MED ORDER — LIDOCAINE 2% (20 MG/ML) 5 ML SYRINGE
INTRAMUSCULAR | Status: AC
  Filled 2019-09-10: qty 5

## 2019-09-10 MED ORDER — ONDANSETRON HCL 4 MG/2ML IJ SOLN
INTRAMUSCULAR | Status: AC
  Filled 2019-09-10: qty 2

## 2019-09-10 MED ORDER — PROPOFOL 10 MG/ML IV BOLUS
INTRAVENOUS | Status: DC | PRN
  Administered 2019-09-10: 160 mg via INTRAVENOUS

## 2019-09-10 MED ORDER — OXYCODONE HCL 5 MG PO TABS
5.0000 mg | ORAL_TABLET | Freq: Once | ORAL | Status: AC | PRN
  Administered 2019-09-10: 5 mg via ORAL

## 2019-09-10 MED ORDER — MIDAZOLAM HCL 2 MG/2ML IJ SOLN
INTRAMUSCULAR | Status: AC
  Filled 2019-09-10: qty 2

## 2019-09-10 MED ORDER — FENTANYL CITRATE (PF) 100 MCG/2ML IJ SOLN
25.0000 ug | INTRAMUSCULAR | Status: DC | PRN
  Administered 2019-09-10 (×2): 50 ug via INTRAVENOUS

## 2019-09-10 MED ORDER — DEXAMETHASONE SODIUM PHOSPHATE 10 MG/ML IJ SOLN
INTRAMUSCULAR | Status: DC | PRN
  Administered 2019-09-10: 10 mg via INTRAVENOUS

## 2019-09-10 MED ORDER — POVIDONE-IODINE 10 % EX SWAB: 2.0000 "application " | Freq: Once | CUTANEOUS | Status: DC

## 2019-09-10 MED ORDER — LIDOCAINE 2% (20 MG/ML) 5 ML SYRINGE
INTRAMUSCULAR | Status: DC | PRN
  Administered 2019-09-10: 60 mg via INTRAVENOUS

## 2019-09-10 MED ORDER — ROPIVACAINE HCL 7.5 MG/ML IJ SOLN
INTRAMUSCULAR | Status: DC | PRN
  Administered 2019-09-10: 25 mL via PERINEURAL

## 2019-09-10 MED ORDER — MIDAZOLAM HCL 2 MG/2ML IJ SOLN
1.0000 mg | INTRAMUSCULAR | Status: DC | PRN
  Administered 2019-09-10: 2 mg via INTRAVENOUS

## 2019-09-10 MED ORDER — FENTANYL CITRATE (PF) 100 MCG/2ML IJ SOLN
50.0000 ug | INTRAMUSCULAR | Status: DC | PRN
  Administered 2019-09-10: 100 ug via INTRAVENOUS

## 2019-09-10 MED ORDER — PROPOFOL 10 MG/ML IV BOLUS
INTRAVENOUS | Status: AC
  Filled 2019-09-10: qty 20

## 2019-09-10 MED ORDER — DEXAMETHASONE SODIUM PHOSPHATE 10 MG/ML IJ SOLN
INTRAMUSCULAR | Status: DC | PRN
  Administered 2019-09-10: 4 mg via INTRAVENOUS

## 2019-09-10 MED ORDER — ACETAMINOPHEN 325 MG PO TABS: 325.0000 mg | ORAL_TABLET | ORAL | Status: DC | PRN

## 2019-09-10 MED ORDER — LACTATED RINGERS IV SOLN: INTRAVENOUS | Status: DC

## 2019-09-10 MED ORDER — SODIUM CHLORIDE 0.9 % IR SOLN
Status: DC | PRN
  Administered 2019-09-10: 13500 mL

## 2019-09-10 MED ORDER — BUPIVACAINE HCL (PF) 0.25 % IJ SOLN
INTRAMUSCULAR | Status: DC | PRN
  Administered 2019-09-10: 20 mL

## 2019-09-10 MED ORDER — ACETAMINOPHEN 160 MG/5ML PO SOLN: 325.0000 mg | ORAL | Status: DC | PRN

## 2019-09-10 SURGICAL SUPPLY — 92 items
ANCHOR BUTTON TIGHTROPE BTB (Anchor) ×3 IMPLANT
BANDAGE ESMARK 6X9 LF (GAUZE/BANDAGES/DRESSINGS) ×1 IMPLANT
BENZOIN TINCTURE PRP APPL 2/3 (GAUZE/BANDAGES/DRESSINGS) ×3 IMPLANT
BLADE AVERAGE 25MMX9MM (BLADE) ×1
BLADE AVERAGE 25X9 (BLADE) ×2 IMPLANT
BLADE EXCALIBUR 4.0MM X 13CM (MISCELLANEOUS) ×1
BLADE EXCALIBUR 4.0X13 (MISCELLANEOUS) ×2 IMPLANT
BLADE SHAVER TORPEDO 4X13 (MISCELLANEOUS) ×3 IMPLANT
BLADE SURG 15 STRL LF DISP TIS (BLADE) ×2 IMPLANT
BLADE SURG 15 STRL SS (BLADE) ×4
BNDG ELASTIC 6X5.8 VLCR STR LF (GAUZE/BANDAGES/DRESSINGS) ×3 IMPLANT
BNDG ESMARK 6X9 LF (GAUZE/BANDAGES/DRESSINGS) ×3
BURR OVAL 8 FLU 4.0MM X 13CM (MISCELLANEOUS) ×1
BURR OVAL 8 FLU 4.0X13 (MISCELLANEOUS) ×2 IMPLANT
CLOSURE WOUND 1/2 X4 (GAUZE/BANDAGES/DRESSINGS) ×1
COVER BACK TABLE 60X90IN (DRAPES) ×3 IMPLANT
COVER WAND RF STERILE (DRAPES) IMPLANT
CUFF TOURN SGL QUICK 34 (TOURNIQUET CUFF) ×2
CUFF TRNQT CYL 34X4.125X (TOURNIQUET CUFF) ×1 IMPLANT
DRAPE ARTHROSCOPY W/POUCH 90 (DRAPES) ×3 IMPLANT
DRAPE IMP U-DRAPE 54X76 (DRAPES) ×3 IMPLANT
DRAPE INCISE IOBAN 66X45 STRL (DRAPES) IMPLANT
DRAPE OEC MINIVIEW 54X84 (DRAPES) ×3 IMPLANT
DRAPE U-SHAPE 47X51 STRL (DRAPES) IMPLANT
DRILL FLIPCUTTER III 6-12 (ORTHOPEDIC DISPOSABLE SUPPLIES) ×1 IMPLANT
DRSG PAD ABDOMINAL 8X10 ST (GAUZE/BANDAGES/DRESSINGS) ×3 IMPLANT
DURAPREP 26ML APPLICATOR (WOUND CARE) ×3 IMPLANT
ELECT REM PT RETURN 9FT ADLT (ELECTROSURGICAL) ×3
ELECTRODE REM PT RTRN 9FT ADLT (ELECTROSURGICAL) ×1 IMPLANT
FLIPCUTTER III 6-12 AR-1204FF (ORTHOPEDIC DISPOSABLE SUPPLIES) ×3
GAUZE SPONGE 4X4 12PLY STRL (GAUZE/BANDAGES/DRESSINGS) ×3 IMPLANT
GAUZE XEROFORM 1X8 LF (GAUZE/BANDAGES/DRESSINGS) ×3 IMPLANT
GLOVE BIOGEL PI IND STRL 7.0 (GLOVE) ×2 IMPLANT
GLOVE BIOGEL PI IND STRL 7.5 (GLOVE) ×2 IMPLANT
GLOVE BIOGEL PI INDICATOR 7.0 (GLOVE) ×4
GLOVE BIOGEL PI INDICATOR 7.5 (GLOVE) ×4
GLOVE ECLIPSE 6.5 STRL STRAW (GLOVE) ×6 IMPLANT
GLOVE ECLIPSE 7.0 STRL STRAW (GLOVE) IMPLANT
GLOVE ECLIPSE 7.5 STRL STRAW (GLOVE) ×3 IMPLANT
GLOVE SKINSENSE NS SZ7.5 (GLOVE) ×2
GLOVE SKINSENSE STRL SZ7.5 (GLOVE) ×1 IMPLANT
GLOVE SURG SYN 7.5  E (GLOVE) ×2
GLOVE SURG SYN 7.5 E (GLOVE) ×1 IMPLANT
GOWN STRL REIN XL XLG (GOWN DISPOSABLE) ×3 IMPLANT
GOWN STRL REUS W/ TWL LRG LVL3 (GOWN DISPOSABLE) ×2 IMPLANT
GOWN STRL REUS W/ TWL XL LVL3 (GOWN DISPOSABLE) ×1 IMPLANT
GOWN STRL REUS W/TWL LRG LVL3 (GOWN DISPOSABLE) ×4
GOWN STRL REUS W/TWL XL LVL3 (GOWN DISPOSABLE) ×2
IMMOBILIZER KNEE 22 UNIV (SOFTGOODS) ×3 IMPLANT
IMMOBILIZER KNEE 24 THIGH 36 (MISCELLANEOUS) IMPLANT
IMMOBILIZER KNEE 24 UNIV (MISCELLANEOUS)
IV NS IRRIG 3000ML ARTHROMATIC (IV SOLUTION) ×15 IMPLANT
KIT TRANSTIBIAL (DISPOSABLE) ×3 IMPLANT
KNEE WRAP E Z 3 GEL PACK (MISCELLANEOUS) ×3 IMPLANT
KNIFE GRAFT ACL 10MM 5952 (MISCELLANEOUS) IMPLANT
KNIFE GRAFT ACL 9MM (MISCELLANEOUS) IMPLANT
MANIFOLD NEPTUNE II (INSTRUMENTS) ×3 IMPLANT
NDL SUT 6 .5 CRC .975X.05 MAYO (NEEDLE) IMPLANT
NEEDLE MAYO TAPER (NEEDLE)
NS IRRIG 1000ML POUR BTL (IV SOLUTION) ×3 IMPLANT
PACK ARTHROSCOPY DSU (CUSTOM PROCEDURE TRAY) ×3 IMPLANT
PACK BASIN DAY SURGERY FS (CUSTOM PROCEDURE TRAY) ×3 IMPLANT
PADDING CAST COTTON 6X4 STRL (CAST SUPPLIES) ×3 IMPLANT
PATELLA LIGAMENT BISECTED FR (Tissue) ×3 IMPLANT
PENCIL SMOKE EVACUATOR (MISCELLANEOUS) ×3 IMPLANT
PORT APPOLLO RF 90DEGREE MULTI (SURGICAL WAND) ×3 IMPLANT
SCREW BIOCOMPOSITE 8X20 INTER (Screw) ×3 IMPLANT
SLEEVE SCD COMPRESS KNEE MED (MISCELLANEOUS) ×3 IMPLANT
SPONGE LAP 18X18 RF (DISPOSABLE) ×3 IMPLANT
SPONGE LAP 4X18 RFD (DISPOSABLE) IMPLANT
STRIP CLOSURE SKIN 1/2X4 (GAUZE/BANDAGES/DRESSINGS) ×2 IMPLANT
SUCTION FRAZIER HANDLE 10FR (MISCELLANEOUS) ×2
SUCTION TUBE FRAZIER 10FR DISP (MISCELLANEOUS) ×1 IMPLANT
SUT ETHILON 2 0 FS 18 (SUTURE) IMPLANT
SUT ETHILON 3 0 PS 1 (SUTURE) IMPLANT
SUT ETHILON 4 0 PS 2 18 (SUTURE) ×3 IMPLANT
SUT FIBERWIRE #2 38 T-5 BLUE (SUTURE) ×3
SUT VIC AB 0 CT1 27 (SUTURE)
SUT VIC AB 0 CT1 27XBRD ANBCTR (SUTURE) IMPLANT
SUT VIC AB 1 CT1 27 (SUTURE)
SUT VIC AB 1 CT1 27XBRD ANBCTR (SUTURE) IMPLANT
SUT VIC AB 2-0 SH 27 (SUTURE) ×4
SUT VIC AB 2-0 SH 27XBRD (SUTURE) ×2 IMPLANT
SUT VIC AB 3-0 FS2 27 (SUTURE) IMPLANT
SUT VIC AB 3-0 SH 27 (SUTURE) ×2
SUT VIC AB 3-0 SH 27X BRD (SUTURE) ×1 IMPLANT
SUTURE FIBERWR #2 38 T-5 BLUE (SUTURE) ×1 IMPLANT
TOURNIQUET 1X18 LF ORANGE (MISCELLANEOUS) ×3 IMPLANT
TOWEL GREEN STERILE FF (TOWEL DISPOSABLE) ×6 IMPLANT
TUBING ARTHROSCOPY IRRIG 16FT (MISCELLANEOUS) ×3 IMPLANT
WATER STERILE IRR 1000ML POUR (IV SOLUTION) IMPLANT
YANKAUER SUCT BULB TIP NO VENT (SUCTIONS) IMPLANT

## 2019-09-10 NOTE — Transfer of Care (Signed)
Immediate Anesthesia Transfer of Care Note  Patient: Peter Beck  Procedure(s) Performed: LEFT KNEE ARTHROSCOPY WITH ANTERIOR CRUCIATE LIGAMENT (ACL) REPAIR (Left Knee)  Patient Location: PACU  Anesthesia Type:General  Level of Consciousness: sedated, patient cooperative and responds to stimulation  Airway & Oxygen Therapy: Patient Spontanous Breathing and Patient connected to nasal cannula oxygen  Post-op Assessment: Report given to RN, Post -op Vital signs reviewed and stable and Patient moving all extremities  Post vital signs: Reviewed and stable  Last Vitals:  Vitals Value Taken Time  BP 127/95 09/10/19 1100  Temp 37 C 09/10/19 1100  Pulse 93 09/10/19 1102  Resp 13 09/10/19 1102  SpO2 98 % 09/10/19 1102  Vitals shown include unvalidated device data.  Last Pain:  Vitals:   09/10/19 0744  TempSrc: Tympanic  PainSc: 0-No pain         Complications: No apparent anesthesia complications

## 2019-09-10 NOTE — Op Note (Signed)
   Date of Surgery: 09/10/2019  INDICATIONS: Peter Beck is a 34 y.o.-year-old male with a left ACL tear that failed conservative treatment;  The patient did consent to the procedure after discussion of the risks and benefits.  PREOPERATIVE DIAGNOSIS: Left ACL tear  POSTOPERATIVE DIAGNOSIS: Same.  PROCEDURE: Arthroscopic left ACL reconstruction  SURGEON: N. Glee Arvin, M.D.  ASSIST: Starlyn Skeans [place], [place]; necessary for the timely completion of procedure and due to complexity of procedure.  ANESTHESIA:  general, abductor canal block  IV FLUIDS AND URINE: See anesthesia.  ESTIMATED BLOOD LOSS: Minimal mL.  IMPLANTS: Allograft BTB, Arthrex 8 x 20 bio omposite interference screw  DRAINS: None  COMPLICATIONS: see description of procedure.  DESCRIPTION OF PROCEDURE: The patient was brought to the operating room.  The patient had been signed prior to the procedure and this was documented. The patient had the anesthesia placed by the anesthesiologist.  A time-out was performed to confirm that this was the correct patient, site, side and location. The patient did receive antibiotics prior to the incision and was re-dosed during the procedure as needed at indicated intervals.  A tourniquet was placed.  The patient had the operative extremity prepped and draped in the standard surgical fashion.    Incisions were made for arthroscopic knee portals.  We first performed a diagnostic knee arthroscopy which revealed an ACL tear without any other pathologic findings.  We found the remnant of the ACL stump that was torn off of the femoral attachment.  The remnant of the ACL was debrided.  Notchplasty was then performed using a high-speed bur.  On the back table a bone patellar tendon bone allograft was prepared to a size 9.0 mm on both the femoral and the tibial side with 20 mm bone plug on both sides.  The graft itself was 85 mm long.  Then we used the ACL drill guide for the femoral side and a stab  incision was made on the lateral portion of the distal femur.  The guide was positioned on the anatomic femoral attachment location and the drill was advanced into the joint using arthroscopic visualization.  A 9.5 mm flip cutter was then used to create a 25 mm femoral tunnel.  The joint was then lavaged.  We then turned our attention to the tibial tunnel which was also drilled with a 9.5 mm flip cutter.  The drill guide was placed onto the native tibial ACL attachment.  The graft was then advanced transtibial into the femoral tunnel.  The button was flipped and confirmed under fluoroscopy.  The graft was then tightened down and the knee was cycled 20 times.  Nitinol wire was then placed through the tibial tunnel and a 8 x 20 bio composite interference screw was placed over the nitinol wire and advanced until was flush with the tibial cortex.  This gave excellent fixation.  Nitonol wire was removed.  The knee joint was thoroughly lavaged.  Incisions were closed in a layered fashion.  Sterile dressings were applied.  Knee immobilizer was placed.  Patient tolerated procedure well had no immediate complications.  POSTOPERATIVE PLAN: Discharge home and follow-up in 1 week for suture removal.  N. Glee Arvin, MD 10:32 AM

## 2019-09-10 NOTE — Anesthesia Postprocedure Evaluation (Signed)
Anesthesia Post Note  Patient: Peter Beck  Procedure(s) Performed: LEFT KNEE ARTHROSCOPY WITH ANTERIOR CRUCIATE LIGAMENT (ACL) REPAIR (Left Knee)     Patient location during evaluation: PACU Anesthesia Type: General Level of consciousness: awake and alert Pain management: pain level controlled Vital Signs Assessment: post-procedure vital signs reviewed and stable Respiratory status: spontaneous breathing, nonlabored ventilation, respiratory function stable and patient connected to nasal cannula oxygen Cardiovascular status: blood pressure returned to baseline and stable Postop Assessment: no apparent nausea or vomiting Anesthetic complications: no    Last Vitals:  Vitals:   09/10/19 1315 09/10/19 1415  BP: (!) 119/99 (!) 115/92  Pulse: 94 (!) 103  Resp:  18  Temp:  36.9 C  SpO2: 100% 98%    Last Pain:  Vitals:   09/10/19 1415  TempSrc: Oral  PainSc: 3                  Muaaz Brau

## 2019-09-10 NOTE — Anesthesia Procedure Notes (Signed)
Anesthesia Regional Block: Adductor canal block   Pre-Anesthetic Checklist: ,, timeout performed, Correct Patient, Correct Site, Correct Laterality, Correct Procedure, Correct Position, site marked, Risks and benefits discussed,  Surgical consent,  Pre-op evaluation,  At surgeon's request and post-op pain management  Laterality: Left  Prep: chloraprep       Needles:  Injection technique: Single-shot  Needle Type: Echogenic Stimulator Needle     Needle Length: 5cm  Needle Gauge: 22     Additional Needles:   Procedures:, nerve stimulator,,, ultrasound used (permanent image in chart),,,,  Narrative:  Start time: 09/10/2019 8:05 AM End time: 09/10/2019 8:10 AM Injection made incrementally with aspirations every 5 mL.  Performed by: Personally  Anesthesiologist: Bethena Midget, MD  Additional Notes: Functioning IV was confirmed and monitors were applied.  A 29mm 22ga Arrow echogenic stimulator needle was used. Sterile prep and drape,hand hygiene and sterile gloves were used. Ultrasound guidance: relevant anatomy identified, needle position confirmed, local anesthetic spread visualized around nerve(s)., vascular puncture avoided.  Image printed for medical record. Negative aspiration and negative test dose prior to incremental administration of local anesthetic. The patient tolerated the procedure well.

## 2019-09-10 NOTE — Progress Notes (Signed)
Assisted Dr. Oddono with left, ultrasound guided, adductor canal block. Side rails up, monitors on throughout procedure. See vital signs in flow sheet. Tolerated Procedure well.  

## 2019-09-10 NOTE — Anesthesia Procedure Notes (Signed)
Procedure Name: LMA Insertion Date/Time: 09/10/2019 8:38 AM Performed by: Lucinda Dell, CRNA Pre-anesthesia Checklist: Patient identified, Emergency Drugs available, Suction available and Patient being monitored Patient Re-evaluated:Patient Re-evaluated prior to induction Oxygen Delivery Method: Circle system utilized Preoxygenation: Pre-oxygenation with 100% oxygen Induction Type: IV induction Ventilation: Mask ventilation without difficulty LMA: LMA inserted LMA Size: 4.0 Number of attempts: 1 Placement Confirmation: positive ETCO2 and breath sounds checked- equal and bilateral Tube secured with: Tape Dental Injury: Teeth and Oropharynx as per pre-operative assessment

## 2019-09-10 NOTE — Discharge Instructions (Signed)
Post-operative patient instructions Knee ACL Reconstruction   Left knee ACL reconstruction with allograft.   Ice: Place intermittent ice or cooler pack over your knee, 30 minutes on and 30 minutes off. Continue this for the first 72 hours after surgery, then save ice for use after therapy sessions or on more active days.  Weight: You may place weight on your leg as your symptoms allow (with brace and crutches)  Brace: You have a knee brace locked holding your knee straight placed over your surgical dressings. Keep brace on for walking until physical therapist or physician sees your strength and leg control improve and discontinues brace. Wear brace also at night to help with knee straightening.  Crutches: Use crutches to assist in walking until told to discontinue by your physical therapist or physician. This will help to reduce pain.  Strengthening: Perform simple thigh squeezes (isometric quad contractions) and straight leg lifts as you are able (3 sets of 5 to 10 repetitions, 3 times a day). For the leg lifts, have someone support under your ankle in the beginning until you have increased strength enough to do this on your own. To help get started on thigh squeezes, place a pillow under your knee and push down on the pillow with back of knee (sometimes easier to do than with your leg fully straight).  Motion: Perform gentle knee motion as tolerated - this is gentle bending and straightening of the knee. Seated heel slides: you can start by sitting in a chair, remove your brace, and gently slide your heel back on the floor - allowing your knee to bend. Have someone help you straighten your knee (or use your other leg/foot hooked under your ankle. Also spend time working on knee straightening by placing a folded towel under your foot/heel and allow gravity to help knee fall straight (5-10 min at a time 3-4 x per day)  Dressing: Perform 1st dressing change at 4-5 days postoperative. A moderate amount  of blood tinged drainage is to be expected. So if you bleed through the dressing on the first or second day or if you have fevers, it is fine to change the dressing/check the wounds early, recover with new gauze and tegaderm (water resistant) as shown by MD, and rewrap with an ace bandage. Elevate your leg. If it bleeds through again, or if the incisions are leaking frank blood, please call the office. May change dressing every 1-2 days thereafter to check wound appearance. Many pharmacies have a similar water resistant dressing (ex 76M Nexcare).  Shower: Keep wounds dry and covered x 14 days. Do not get wound wet until sutures removed. MD has provided you with initial tegaderm dressing supplies to place over simple dry gauze applied to incisions.  Pain medication: A narcotic pain medication has been prescribed. Take as directed. Typically you need narcotic pain medication more regularly during the first 3 to 5 days after surgery. Decrease your use of the medication as the pain improves. Narcotics can sometimes cause constipation, even after a few doses. If you have problems with constipation, you can take an over the counter stool softener or light laxative. If you have persistent problems, please notify your physician's office.  Physical therapy: Additonal activity guidelines to be provided by your physician or physical therapist at follow-up visits.  Call [phone] for questions or problems. Evenings you will be forwarded to the hospital operator. Ask for the orthopaedic physician on call. Please call if you experience:  Redness, cloudy, or foul  smelling drainage at the surgical site  worsening knee pain and swelling not responsive to medication  any calf pain and or swelling of the lower leg and foot  medication intolerance  temperature greater than 100.5*F, especially during the first 3-4 weeks post surgical  other questions or concerns Thank you for allowing [place] to be a part of your  care.      Post Anesthesia Home Care Instructions  Activity: Get plenty of rest for the remainder of the day. A responsible individual must stay with you for 24 hours following the procedure.  For the next 24 hours, DO NOT: -Drive a car -Advertising copywriter -Drink alcoholic beverages -Take any medication unless instructed by your physician -Make any legal decisions or sign important papers.  Meals: Start with liquid foods such as gelatin or soup. Progress to regular foods as tolerated. Avoid greasy, spicy, heavy foods. If nausea and/or vomiting occur, drink only clear liquids until the nausea and/or vomiting subsides. Call your physician if vomiting continues.  Special Instructions/Symptoms: Your throat may feel dry or sore from the anesthesia or the breathing tube placed in your throat during surgery. If this causes discomfort, gargle with warm salt water. The discomfort should disappear within 24 hours.  If you had a scopolamine patch placed behind your ear for the management of post- operative nausea and/or vomiting:  1. The medication in the patch is effective for 72 hours, after which it should be removed.  Wrap patch in a tissue and discard in the trash. Wash hands thoroughly with soap and water. 2. You may remove the patch earlier than 72 hours if you experience unpleasant side effects which may include dry mouth, dizziness or visual disturbances. 3. Avoid touching the patch. Wash your hands with soap and water after contact with the patch.     Regional Anesthesia Blocks  1. Numbness or the inability to move the "blocked" extremity may last from 3-48 hours after placement. The length of time depends on the medication injected and your individual response to the medication. If the numbness is not going away after 48 hours, call your surgeon.  2. The extremity that is blocked will need to be protected until the numbness is gone and the  Strength has returned. Because you cannot  feel it, you will need to take extra care to avoid injury. Because it may be weak, you may have difficulty moving it or using it. You may not know what position it is in without looking at it while the block is in effect.  3. For blocks in the legs and feet, returning to weight bearing and walking needs to be done carefully. You will need to wait until the numbness is entirely gone and the strength has returned. You should be able to move your leg and foot normally before you try and bear weight or walk. You will need someone to be with you when you first try to ensure you do not fall and possibly risk injury.  4. Bruising and tenderness at the needle site are common side effects and will resolve in a few days.  5. Persistent numbness or new problems with movement should be communicated to the surgeon or the [facility] [phone] [facility] [phone]).

## 2019-09-10 NOTE — H&P (Signed)
    PREOPERATIVE H&P  Chief Complaint: left knee anterior cruciate ligament tear  HPI: Peter Beck is a 34 y.o. male who presents for surgical treatment of left knee anterior cruciate ligament tear.  He denies any changes in medical history.  Past Medical History:  Diagnosis Date  . Family history of adverse reaction to anesthesia    mom slow to wake up   Past Surgical History:  Procedure Laterality Date  . APPENDECTOMY     Social History   Socioeconomic History  . Marital status: Single    Spouse name: Not on file  . Number of children: Not on file  . Years of education: Not on file  . Highest education level: Not on file  Occupational History  . Not on file  Tobacco Use  . Smoking status: Current Some Day Smoker  . Smokeless tobacco: Never Used  Substance and Sexual Activity  . Alcohol use: Yes    Comment: occasionally  . Drug use: Never  . Sexual activity: Not on file  Other Topics Concern  . Not on file  Social History Narrative  . Not on file   Social Determinants of Health   Financial Resource Strain:   . Difficulty of Paying Living Expenses:   Food Insecurity:   . Worried About Programme researcher, broadcasting/film/video in the Last Year:   . Barista in the Last Year:   Transportation Needs:   . Freight forwarder (Medical):   Marland Kitchen Lack of Transportation (Non-Medical):   Physical Activity:   . Days of Exercise per Week:   . Minutes of Exercise per Session:   Stress:   . Feeling of Stress :   Social Connections:   . Frequency of Communication with Friends and Family:   . Frequency of Social Gatherings with Friends and Family:   . Attends Religious Services:   . Active Member of Clubs or Organizations:   . Attends Banker Meetings:   Marland Kitchen Marital Status:    History reviewed. No pertinent family history. Allergies  Allergen Reactions  . Lactose Intolerance (Gi)    Prior to Admission medications   Medication Sig Start Date End Date Taking?  Authorizing Provider  diclofenac (VOLTAREN) 75 MG EC tablet Take 1 tablet (75 mg total) by mouth 2 (two) times daily. 08/13/19  Yes Cristie Hem, PA-C  traMADol (ULTRAM) 50 MG tablet Take 1 tablet (50 mg total) by mouth every 6 (six) hours as needed. 08/13/19  Yes Cristie Hem, PA-C     Positive ROS: All other systems have been reviewed and were otherwise negative with the exception of those mentioned in the HPI and as above.  Physical Exam: General: Alert, no acute distress Cardiovascular: No pedal edema Respiratory: No cyanosis, no use of accessory musculature GI: abdomen soft Skin: No lesions in the area of chief complaint Neurologic: Sensation intact distally Psychiatric: Patient is competent for consent with normal mood and affect Lymphatic: no lymphedema  MUSCULOSKELETAL: exam stable  Assessment: left knee anterior cruciate ligament tear  Plan: Plan for Procedure(s): LEFT KNEE ARTHROSCOPY WITH ANTERIOR CRUCIATE LIGAMENT (ACL) REPAIR  The risks benefits and alternatives were discussed with the patient including but not limited to the risks of nonoperative treatment, versus surgical intervention including infection, bleeding, nerve injury,  blood clots, cardiopulmonary complications, morbidity, mortality, among others, and they were willing to proceed.   Glee Arvin, MD   09/10/2019 8:07 AM

## 2019-09-12 ENCOUNTER — Encounter: Payer: Self-pay | Admitting: *Deleted

## 2019-09-17 ENCOUNTER — Telehealth: Payer: Self-pay | Admitting: Orthopaedic Surgery

## 2019-09-17 ENCOUNTER — Ambulatory Visit (INDEPENDENT_AMBULATORY_CARE_PROVIDER_SITE_OTHER): Payer: Self-pay | Admitting: Physician Assistant

## 2019-09-17 ENCOUNTER — Other Ambulatory Visit: Payer: Self-pay

## 2019-09-17 DIAGNOSIS — S83512D Sprain of anterior cruciate ligament of left knee, subsequent encounter: Secondary | ICD-10-CM

## 2019-09-17 MED ORDER — HYDROCODONE-ACETAMINOPHEN 5-325 MG PO TABS: 1.0000 | ORAL_TABLET | Freq: Every day | ORAL | 0 refills | Status: AC | PRN

## 2019-09-17 NOTE — Telephone Encounter (Signed)
Patient is wanting to get a handicap placard.  Please call when application is ready for p/u.  CB#[phone].  Thank you.

## 2019-09-17 NOTE — Telephone Encounter (Signed)
Ok for placard? For how long? 

## 2019-09-17 NOTE — Progress Notes (Signed)
   Post-Op Visit Note   Patient: Peter Beck           Date of Birth: 10/28/1985           MRN: [id number] Visit Date: 09/17/2019 PCP: Patient, No Pcp Per   Assessment & Plan:  Chief Complaint:  Chief Complaint  Patient presents with  . Left Knee - Routine Post Op   Visit Diagnoses:  1. Rupture of anterior cruciate ligament of left knee, subsequent encounter     Plan: Patient is a pleasant 34 year old Spanish-speaking gentleman who is here with his wife who is acting as his interpreter.  He is 1 week out left knee ACL reconstruction with patella tendon allograft.  He has been doing well.  He has been compliant wearing his knee immobilizer.  He has been taking narcotic pain medication at night which has been helping with his pain.  Examination of his left knee reveals well-healing surgical incisions with out evidence of infection or cellulitis.  He does have some nylon sutures in place.  He has a moderate size effusion to the left knee.  Calf is soft nontender.  He is neurovascular intact distally.  Today, nylon sutures were removed and Steri-Strips applied.  He will continue to wear his knee immobilizer weightbearing as tolerated.  He will go ahead and start physical therapy per Dr. Diamantina Providence ACL reconstruction protocol.  He will follow-up with [place] in 5 weeks time for recheck.  I refilled his pain medicine.  Call with concerns or questions in meantime.  Follow-Up Instructions: Return in about 5 weeks (around 10/22/2019).   Orders:  Orders Placed This Encounter  Procedures  . Ambulatory referral to Physical Therapy   Meds ordered this encounter  Medications  . HYDROcodone-acetaminophen (NORCO) 5-325 MG tablet    Sig: Take 1-2 tablets by mouth daily as needed.    Dispense:  20 tablet    Refill:  0    Imaging: No new imaging  PMFS History: Patient Active Problem List   Diagnosis Date Noted  . Left anterior cruciate ligament tear 08/27/2019   Past Medical History:  Diagnosis Date    . Family history of adverse reaction to anesthesia    mom slow to wake up    No family history on file.  Past Surgical History:  Procedure Laterality Date  . APPENDECTOMY    . KNEE ARTHROSCOPY WITH ANTERIOR CRUCIATE LIGAMENT (ACL) REPAIR Left 09/10/2019   Procedure: LEFT KNEE ARTHROSCOPY WITH ANTERIOR CRUCIATE LIGAMENT (ACL) REPAIR;  Surgeon: Tarry Kos, MD;  Location: [facility];  Service: Orthopedics;  Laterality: Left;   Social History   Occupational History  . Not on file  Tobacco Use  . Smoking status: Current Some Day Smoker  . Smokeless tobacco: Never Used  Substance and Sexual Activity  . Alcohol use: Yes    Comment: occasionally  . Drug use: Never  . Sexual activity: Not on file

## 2019-09-17 NOTE — Telephone Encounter (Signed)
6 months

## 2019-09-18 NOTE — Telephone Encounter (Signed)
Patient aware.

## 2019-09-18 NOTE — Telephone Encounter (Signed)
Sending back to you because I am in [place] office today and can't complete for patient. Thanks.

## 2019-09-19 ENCOUNTER — Ambulatory Visit: Payer: Self-pay | Attending: Physical Therapy | Admitting: Physical Therapy

## 2019-09-22 ENCOUNTER — Encounter: Payer: Self-pay | Admitting: Physical Therapy

## 2019-09-22 ENCOUNTER — Other Ambulatory Visit: Payer: Self-pay

## 2019-09-22 ENCOUNTER — Ambulatory Visit: Payer: Self-pay | Attending: Physician Assistant | Admitting: Physical Therapy

## 2019-09-22 ENCOUNTER — Ambulatory Visit: Payer: Self-pay | Admitting: Physical Therapy

## 2019-09-22 DIAGNOSIS — M6281 Muscle weakness (generalized): Secondary | ICD-10-CM | POA: Insufficient documentation

## 2019-09-22 DIAGNOSIS — Z9889 Other specified postprocedural states: Secondary | ICD-10-CM | POA: Insufficient documentation

## 2019-09-22 DIAGNOSIS — M25662 Stiffness of left knee, not elsewhere classified: Secondary | ICD-10-CM | POA: Insufficient documentation

## 2019-09-22 NOTE — Therapy (Signed)
[facility] [address] [place], [place], [address] Phone: [phone]   Fax:  [phone]  Physical Therapy Evaluation  Patient Details  Name: Peter Beck MRN: [id number] Date of Birth: 10/28/1985 Referring Provider (PT): Cristie Hem, [place]   Encounter Date: 09/22/2019  PT End of Session - 09/22/19 1721    Visit Number  1    Number of Visits  25    Date for PT Re-Evaluation  12/19/19    Authorization Type  Self Pay    PT Start Time  1723   Pt. arrived lated   PT Stop Time  1803    PT Time Calculation (min)  40 min    Equipment Utilized During Treatment  Left knee immobilizer    Activity Tolerance  Patient tolerated treatment well    Behavior During Therapy  [facility] for tasks assessed/performed       Past Medical History:  Diagnosis Date  . Family history of adverse reaction to anesthesia    mom slow to wake up    Past Surgical History:  Procedure Laterality Date  . APPENDECTOMY    . KNEE ARTHROSCOPY WITH ANTERIOR CRUCIATE LIGAMENT (ACL) REPAIR Left 09/10/2019   Procedure: LEFT KNEE ARTHROSCOPY WITH ANTERIOR CRUCIATE LIGAMENT (ACL) REPAIR;  Surgeon: Tarry Kos, MD;  Location: [facility];  Service: Orthopedics;  Laterality: Left;    There were no vitals filed for this visit.   Subjective Assessment - 09/22/19 1729    Subjective  "I had an accident where I fell. I stood up and bent my knee." Patient is 2 weeks  post-op acl repair." Patient reports no N/T    Limitations  Sitting;Walking    Patient Stated Goals  "I want to be normal"    Currently in Pain?  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         [facility] PT Assessment - 09/22/19 0001      Assessment   Medical Diagnosis  Rupture of anterior cruciate ligament of left knee, subsequent encounter    Referring Provider (PT)  Cristie Hem, PA-C    Onset Date/Surgical Date  09/11/19    Hand Dominance  Right    Next MD Visit  5 weeks from 09/22/2019    Prior Therapy  No      Precautions   Precautions  Knee    Precaution Booklet Issued  Yes (comment)    Required Braces or Orthoses  Knee Immobilizer - Left      Restrictions   Weight Bearing Restrictions  Yes    RLE Weight Bearing  Partial weight bearing    RLE Partial Weight Bearing Percentage or Pounds  50%      Balance Screen   Has the patient fallen in the past 6 months  No    Has the patient had a decrease in activity level because of a fear of falling?   No  Is the patient reluctant to leave their home because of a fear of falling?   No      Home Nurse, mental health  Private residence    Living Arrangements  Spouse/significant other;Children    Type of Home  Apartment    Home Access  Level entry    Home Layout  One level    Home Equipment  Crutches      Prior Function   Level of Independence  Independent    Vocation  Full time employment    Vocation Requirements  Bending, Lifting, Standing     Leisure  Biking, Walking, Playing with kids       Cognition   Overall Cognitive Status  Within Functional Limits for tasks assessed    Attention  Focused    Focused Attention  Appears intact    Memory  Appears intact    Awareness  Appears intact    Problem Solving  Appears intact      ROM / Strength   AROM / PROM / Strength  AROM      AROM   AROM Assessment Site  Knee    Right/Left Knee  Left    Left Knee Extension  -3    Left Knee Flexion  45      Ambulation/Gait   Ambulation/Gait  Yes    Ambulation/Gait Assistance  6: Modified independent (Device/Increase time)     Assistive device  Crutches    Gait Pattern  Step-to pattern                Objective measurements completed on examination: See above findings.      [facility] Adult PT Treatment/Exercise - 09/22/19 0001      Exercises   Exercises  Knee/Hip      Knee/Hip Exercises: Standing   Functional Squat  1 set;10 reps    Functional Squat Limitations  chair, 1/4 squats (less than 40 degrees)      Knee/Hip Exercises: Supine   Quad Sets  Left;1 set;10 reps    Short Arc Quad Sets  Left;1 set;10 reps    Straight Leg Raises  Left;1 set;10 reps             PT Education - 09/22/19 1828    Education Details  Patient educated on PWB status, knee precautions, pertinent anatomy,  new HEP, and POC    Person(s) Educated  Patient    Methods  Explanation;Handout;Demonstration    Comprehension  Verbalized understanding       PT Short Term Goals - 09/22/19 1830      PT SHORT TERM GOAL #1   Title  Patient will be independent with initial HEP    Baseline  HEP provided 09/22/2019    Time  6    Period  Weeks    Status  New    Target Date  11/07/19      PT SHORT TERM GOAL #2   Title  Patient will be able to achieve 100 degrees of knee flexion to show improvements with Lt. Knee ROM    Baseline  45 degrees of Lt. Knee flexion    Time  6    Period  Weeks    Status  New    Target Date  11/07/19      PT SHORT TERM GOAL #3   Title  Patient will achieve 0 degrees of Lt. Knee Extension in order to demonstrate quad strength improvement    Baseline  Lt Knee  Extension: -3    Time  6    Period  Weeks    Status  New    Target Date  11/07/19      PT SHORT TERM GOAL #4   Title  Patient will report a max 4/10 pain when bending is knee so that he is able to transfer in and out of his car with less difficulty    Baseline  Patient reports that pain gets as high as an 8/10 when getting in and out of his car    Time  6    Period  Weeks    Status  New    Target Date  11/07/19        PT Long  Term Goals - 09/22/19 1840      PT LONG TERM GOAL #1   Title  Patient will be able to achieve 115 degrees of knee flexion so that he is able to ride a bike, per patient's stated goals    Baseline  45 degrees at eval    Time  12    Period  Weeks    Status  New    Target Date  12/19/19      PT LONG TERM GOAL #2   Title  Patient will achieve a 4+/5 Lt. knee strength in order to return to an active lifestyle    Baseline  Unable to assess    Time  12    Period  Weeks    Status  New    Target Date  12/19/19      PT LONG TERM GOAL #3   Title  Patient will demonstrate an normal gait pattern w/ no assistive device so that he is able to keep up with his children, per patient's stated goals    Baseline  Patient is using crutches, and is PWB with a step-to gait pattern    Time  12    Period  Weeks    Status  New    Target Date  12/19/19      PT LONG TERM GOAL #4   Title  Patient will be independent with all HEPs provided    Time  12    Period  Weeks    Status  New    Target Date  12/19/19             Plan - 09/22/19 1756    Clinical [place] presents to the clinic S/P ACL reconstruction. He is currently using a knee immobilizer and crutches. Patient's post-op precautions were heavily emphasized. He was given exercises that focused on quad strengthening and terminal knee extension. He was able to achieve -3 degrees of knee extension and 45 degrees of active knee flexion. Strength was unable to be assess given the his post-op status. He indicated that he would like to return to biking, work, and playing with his kids. He was able to perform all of his strengthening exercises with minimal cuing. He would benefit from PT to improve his Lt. knee ROM and strength.    Examination-Activity Limitations  Bend;Squat;Stairs;Stand;Transfers;Locomotion Level    Examination-Participation Restrictions  Cleaning;Community Activity;Yard Work;Other    Stability/Clinical Decision  Making  Stable/Uncomplicated    Clinical Decision Making  Low    Rehab Potential  Good    PT Frequency  2x / week    PT Duration  12 weeks    PT Treatment/Interventions  ADLs/Self Care Home Management;Cryotherapy;Electrical Stimulation;Iontophoresis 4mg /ml Dexamethasone;Moist Heat;Traction;Ultrasound;Parrafin;Fluidtherapy;Gait training;Stair training;Functional mobility training;Therapeutic  activities;Therapeutic exercise;Balance training;Neuromuscular re-education;Manual techniques;Patient/family education;Cognitive remediation;Compression bandaging;Scar mobilization;Passive range of motion;Dry needling;Energy conservation;Splinting;Taping;Vasopneumatic Device;Vestibular;Joint Manipulations    PT Next Visit Plan  Revisit precautions, HEP, Quad strengthening, Knee ROM, Terminal knee extension, Patellar mobs    PT Home Exercise Plan  [id number]    Consulted and Agree with Plan of Care  Patient       Patient will benefit from skilled therapeutic intervention in order to improve the following deficits and impairments:  Abnormal gait, Decreased activity tolerance, Decreased balance, Decreased coordination, Decreased endurance, Decreased mobility, Decreased range of motion, Decreased scar mobility, Decreased strength, Difficulty walking, Increased edema, Increased fascial restricitons, Hypomobility, Improper body mechanics, Pain  Visit Diagnosis: S/P ACL surgery  Muscle weakness (generalized)  Decreased range of motion of left knee     Problem List Patient Active Problem List   Diagnosis Date Noted  . Left anterior cruciate ligament tear 08/27/2019    Cato Mulligan, SPT 09/22/2019, 7:12 PM  [facility] [address] [place], [place], [address] Phone: [phone]   Fax:  [phone]  Name: Selvin Yun MRN: [id number] Date of Birth: 10/28/1985

## 2019-09-25 ENCOUNTER — Encounter: Payer: Self-pay | Admitting: Physical Therapy

## 2019-09-25 ENCOUNTER — Other Ambulatory Visit: Payer: Self-pay

## 2019-09-25 ENCOUNTER — Ambulatory Visit: Payer: Self-pay | Admitting: Physical Therapy

## 2019-09-25 DIAGNOSIS — M6281 Muscle weakness (generalized): Secondary | ICD-10-CM

## 2019-09-25 DIAGNOSIS — Z9889 Other specified postprocedural states: Secondary | ICD-10-CM

## 2019-09-25 DIAGNOSIS — M25662 Stiffness of left knee, not elsewhere classified: Secondary | ICD-10-CM

## 2019-09-25 NOTE — Therapy (Signed)
[facility] [address] [place], [place], [address] Phone: [phone]   Fax:  [phone]  Physical Therapy Treatment  Patient Details  Name: Peter Beck MRN: [id number] Date of Birth: 10/28/1985 Referring Provider (PT): Cristie Hem, [place]   Encounter Date: 09/25/2019  PT End of Session - 09/25/19 1517    Visit Number  2    Number of Visits  25    Date for PT Re-Evaluation  12/19/19    Authorization Type  Self Pay    PT Start Time  1516   Pt. arrived late   PT Stop Time  1555    PT Time Calculation (min)  39 min    Equipment Utilized During Treatment  Left knee immobilizer    Activity Tolerance  Patient tolerated treatment well    Behavior During Therapy  [facility] for tasks assessed/performed       Past Medical History:  Diagnosis Date  . Family history of adverse reaction to anesthesia    mom slow to wake up    Past Surgical History:  Procedure Laterality Date  . APPENDECTOMY    . KNEE ARTHROSCOPY WITH ANTERIOR CRUCIATE LIGAMENT (ACL) REPAIR Left 09/10/2019   Procedure: LEFT KNEE ARTHROSCOPY WITH ANTERIOR CRUCIATE LIGAMENT (ACL) REPAIR;  Surgeon: Tarry Kos, MD;  Location: [facility];  Service: Orthopedics;  Laterality: Left;    There were no vitals filed for this visit.  Subjective Assessment - 09/25/19 1518    Subjective  "I'm doing good. I know that I can move my foot better and sitting is easier."    Limitations  Sitting;Walking    Currently in Pain?  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         [facility] PT Assessment - 09/25/19 0001      AROM    AROM Assessment Site  Knee    Right/Left Knee  Left    Left Knee Extension  -1    Left Knee Flexion  82                   [facility] Adult PT Treatment/Exercise - 09/25/19 0001      Exercises   Exercises  Knee/Hip      Knee/Hip Exercises: Standing   Forward Step Up  3 sets;10 reps;Left    Functional Squat  2 sets;10 reps   chair, 1/4 squats     Knee/Hip Exercises: Seated   Heel Slides  Left;2 sets;10 reps   <90 degrees     Knee/Hip Exercises: Supine   Quad Sets  Left;3 sets;10 reps    Straight Leg Raises  3 sets;10 reps;Left      Manual Therapy   Manual Therapy  Soft tissue mobilization    Soft tissue mobilization  Gastroc, Hamstrings               PT Short Term Goals - 09/22/19 1830      PT SHORT TERM GOAL #1  Title  Patient will be independent with initial HEP    Baseline  HEP provided 09/22/2019    Time  6    Period  Weeks    Status  New    Target Date  11/07/19      PT SHORT TERM GOAL #2   Title  Patient will be able to achieve 100 degrees of knee flexion to show improvements with Lt. Knee ROM    Baseline  45 degrees of Lt. Knee flexion    Time  6    Period  Weeks    Status  New    Target Date  11/07/19      PT SHORT TERM GOAL #3   Title  Patient will achieve 0 degrees of Lt. Knee Extension in order to demonstrate quad strength improvement    Baseline  Lt Knee Extension: -3    Time  6    Period  Weeks    Status  New    Target Date  11/07/19      PT SHORT TERM GOAL #4   Title  Patient will report a max 4/10 pain when bending is knee so that he is able to transfer in and out of his car with less difficulty    Baseline  Patient reports that pain gets as high as an 8/10 when getting in and out of his car    Time  6    Period  Weeks    Status  New    Target Date  11/07/19        PT Long Term Goals - 09/22/19 1840      PT LONG TERM GOAL #1   Title  Patient will be able to achieve 115 degrees of knee flexion so that he is able to  ride a bike, per patient's stated goals    Baseline  45 degrees at eval    Time  12    Period  Weeks    Status  New    Target Date  12/19/19      PT LONG TERM GOAL #2   Title  Patient will achieve a 4+/5 Lt. knee strength in order to return to an active lifestyle    Baseline  Unable to assess    Time  12    Period  Weeks    Status  New    Target Date  12/19/19      PT LONG TERM GOAL #3   Title  Patient will demonstrate an normal gait pattern w/ no assistive device so that he is able to keep up with his children, per patient's stated goals    Baseline  Patient is using crutches, and is PWB with a step-to gait pattern    Time  12    Period  Weeks    Status  New    Target Date  12/19/19      PT LONG TERM GOAL #4   Title  Patient will be independent with all HEPs provided    Time  12    Period  Weeks    Status  New    Target Date  12/19/19            Plan - 09/25/19 1556    Clinical Impression Statement  Patient presents to the clinic with an increase in knee ROM. He achieved 82 degrees of active knee flexion and -1 degree of knee extension. He also demonstrates an increase in quad strength. He was able  to tolerate all of the exercises provided. Patient noted some pain in his gastrocs and hamstrings when performing exercises. Manual therapy was applied over painful areas; his muscles were very tight. A message was sent to his surgeon about providing him with a hinge brace. Patient would benefit from PT to further address knee ROM and strength.    Examination-Activity Limitations  Bend;Squat;Stairs;Stand;Transfers;Locomotion Level    Examination-Participation Restrictions  Cleaning;Community Activity;Yard Work;Other    Stability/Clinical Decision Making  Stable/Uncomplicated    Clinical Decision Making  Low    Rehab Potential  Good    PT Frequency  2x / week    PT Duration  12 weeks    PT Treatment/Interventions  ADLs/Self Care Home Management;Cryotherapy;Electrical  Stimulation;Iontophoresis 4mg /ml Dexamethasone;Moist Heat;Traction;Ultrasound;Parrafin;Fluidtherapy;Gait training;Stair training;Functional mobility training;Therapeutic activities;Therapeutic exercise;Balance training;Neuromuscular re-education;Manual techniques;Patient/family education;Cognitive remediation;Compression bandaging;Scar mobilization;Passive range of motion;Dry needling;Energy conservation;Splinting;Taping;Vasopneumatic Device;Vestibular;Joint Manipulations    PT Next Visit Plan  Revisit precautions, HEP, Quad strengthening, Knee ROM, Terminal knee extension, Patellar mobs    PT Home Exercise Plan  [id number]    Consulted and Agree with Plan of Care  Patient       Patient will benefit from skilled therapeutic intervention in order to improve the following deficits and impairments:  Abnormal gait, Decreased activity tolerance, Decreased balance, Decreased coordination, Decreased endurance, Decreased mobility, Decreased range of motion, Decreased scar mobility, Decreased strength, Difficulty walking, Increased edema, Increased fascial restricitons, Hypomobility, Improper body mechanics, Pain  Visit Diagnosis: S/P ACL surgery  Muscle weakness (generalized)  Decreased range of motion of left knee     Problem List Patient Active Problem List   Diagnosis Date Noted  . Left anterior cruciate ligament tear 08/27/2019    Laveda Norman, SPT 09/25/2019, 4:06 PM  [facility] [address] [place], [place], [address] Phone: [phone]   Fax:  [phone]  Name: Peter Beck MRN: [id number] Date of Birth: 10/28/1985

## 2019-09-29 ENCOUNTER — Telehealth: Payer: Self-pay

## 2019-09-29 ENCOUNTER — Ambulatory Visit: Payer: Self-pay | Admitting: Physical Therapy

## 2019-09-29 NOTE — Telephone Encounter (Signed)
Yes he can come back from nurse visit.  Thanks.

## 2019-09-29 NOTE — Telephone Encounter (Signed)
See message below.  Message Received: 4 days ago Message Contents  Cristie Hem, Arva Chafe, [place]; Albertina Parr, RMA  Shanda Bumps,  We are certainly ok with a hinged knee brace.  I do know that he is self pay, so I am not sure he will be able to afford it.     Marisue Ivan, can you send in a rx for this and let patient know?  We can at least give him the option if he is willing to pay for it.      Thanks,  Mardella Layman        Previous Messages    ----- Message -----  From: Army Fossa, PT  Sent: 09/25/2019   3:52 PM EDT  To: Cristie Hem, PA-C   [facility],   Is it possible to have Peter Beck wear a hinge brace rather than the immobilizer? He does not have an appointment with Dr Roda Shutters until 6/2. He is doing very well and he would be much for functional in a hinge brace unless there is a reason you and Dr Roda Shutters would like for him to stay in the immobilizer.   Thanks!  Jessica C. Hightower PT, DPT  09/25/19 3:53 PM       Would you like for me to put him on Nurse schedule since you are booked?  Okay to give him a hinged knee brace if he agrees since he is self pay?

## 2019-09-29 NOTE — Telephone Encounter (Signed)
Patient called stating that a suture was left in his left knee and would like to be seen before Friday, May 14th and would like a call back concerning a brace?  Cb# [phone].  Please advise.  Thank you.

## 2019-09-30 ENCOUNTER — Telehealth: Payer: Self-pay

## 2019-09-30 ENCOUNTER — Ambulatory Visit: Payer: Self-pay

## 2019-09-30 ENCOUNTER — Other Ambulatory Visit: Payer: Self-pay

## 2019-09-30 NOTE — Telephone Encounter (Signed)
Called patient no answer. LMOM to return our call. ? ?

## 2019-09-30 NOTE — Telephone Encounter (Signed)
I called Jessica C. Hightower PT, DPT [phone] to confirm which brace was patient referring to?  Sounds like he wants [place] to get him a donjoy medial unloader brace.   Left Message for Shanda Bumps to return my call.

## 2019-10-02 ENCOUNTER — Ambulatory Visit: Payer: Self-pay | Admitting: Physical Therapy

## 2019-10-03 ENCOUNTER — Ambulatory Visit: Payer: Self-pay | Admitting: Orthopaedic Surgery

## 2019-10-08 ENCOUNTER — Ambulatory Visit: Payer: Self-pay | Admitting: Physical Therapy

## 2019-10-09 ENCOUNTER — Telehealth: Payer: Self-pay | Admitting: Physical Therapy

## 2019-10-09 NOTE — Telephone Encounter (Signed)
Spoke with pt via interpreter- he reports he took flu medicine yesterday and fell asleep. Will be at appointment tomorrow. Peter Beck C. Tylena Prisk PT, DPT 10/09/19 12:40 PM

## 2019-10-10 ENCOUNTER — Other Ambulatory Visit: Payer: Self-pay

## 2019-10-10 ENCOUNTER — Ambulatory Visit: Payer: Self-pay | Admitting: Physical Therapy

## 2019-10-10 ENCOUNTER — Encounter: Payer: Self-pay | Admitting: Physical Therapy

## 2019-10-10 DIAGNOSIS — Z9889 Other specified postprocedural states: Secondary | ICD-10-CM

## 2019-10-10 DIAGNOSIS — M25662 Stiffness of left knee, not elsewhere classified: Secondary | ICD-10-CM

## 2019-10-10 DIAGNOSIS — M6281 Muscle weakness (generalized): Secondary | ICD-10-CM

## 2019-10-10 NOTE — Therapy (Signed)
[place], [place], [address] Phone: [phone]   Fax:  [phone]  Physical Therapy Treatment  Patient Details  Name: Peter Beck MRN: [id number] Date of Birth: 10/28/1985 Referring Provider (PT): Aundra Dubin, [place]   Encounter Date: 10/10/2019  PT End of Session - 10/10/19 0846    Visit Number  3    Number of Visits  25    Date for PT Re-Evaluation  12/19/19    Authorization Type  Self Pay    PT Start Time  3875    PT Stop Time  0927    PT Time Calculation (min)  40 min    Activity Tolerance  Patient tolerated treatment well    Behavior During Therapy  [facility] for tasks assessed/performed       Past Medical History:  Diagnosis Date  . Family history of adverse reaction to anesthesia    mom slow to wake up    Past Surgical History:  Procedure Laterality Date  . APPENDECTOMY    . KNEE ARTHROSCOPY WITH ANTERIOR CRUCIATE LIGAMENT (ACL) REPAIR Left 09/10/2019   Procedure: LEFT KNEE ARTHROSCOPY WITH ANTERIOR CRUCIATE LIGAMENT (ACL) REPAIR;  Surgeon: Leandrew Koyanagi, MD;  Location: [place];  Service: Orthopedics;  Laterality: Left;    There were no vitals filed for this visit.  Subjective Assessment - 10/10/19 0849    Subjective  Much better in the brace. Sometimes I feel like the strength is going away. Reports doing his HEP every day    Currently in Pain?  No/denies                        [facility] Adult PT Treatment/Exercise - 10/10/19 0001      Knee/Hip Exercises: Stretches   Knee: Self-Stretch Limitations  heel slides with foot flexed    Gastroc Stretch Limitations  long sitting with stra      Knee/Hip Exercises: Seated   Other Seated Knee/Hip Exercises  long sitting quad set      Knee/Hip Exercises: Supine   Straight Leg Raises  Left;20 reps    Straight Leg Raises Limitations  cues for quad set    Straight Leg Raise with External Rotation  Left;20 reps       Knee/Hip Exercises: Sidelying   Hip ABduction  Left;20 reps    Hip ABduction Limitations  Added flex/ext arc in abd to increase challenge      Manual Therapy   Manual Therapy  Edema management;Joint mobilization    Edema Management  Lt knee    Joint Mobilization  Lt patella    Soft tissue mobilization  Lt quads               PT Short Term Goals - 09/22/19 1830      PT SHORT TERM GOAL #1   Title  Patient will be independent with initial HEP    Baseline  HEP provided 09/22/2019    Time  6    Period  Weeks    Status  New    Target Date  11/07/19      PT SHORT TERM GOAL #2   Title  Patient will be able to achieve 100 degrees of knee flexion to show improvements with Lt. Knee ROM    Baseline  45 degrees of Lt. Knee flexion    Time  6    Period  Weeks    Status  New  Target Date  11/07/19      PT SHORT TERM GOAL #3   Title  Patient will achieve 0 degrees of Lt. Knee Extension in order to demonstrate quad strength improvement    Baseline  Lt Knee Extension: -3    Time  6    Period  Weeks    Status  New    Target Date  11/07/19      PT SHORT TERM GOAL #4   Title  Patient will report a max 4/10 pain when bending is knee so that he is able to transfer in and out of his car with less difficulty    Baseline  Patient reports that pain gets as high as an 8/10 when getting in and out of his car    Time  6    Period  Weeks    Status  New    Target Date  11/07/19        PT Long Term Goals - 09/22/19 1840      PT LONG TERM GOAL #1   Title  Patient will be able to achieve 115 degrees of knee flexion so that he is able to ride a bike, per patient's stated goals    Baseline  45 degrees at eval    Time  12    Period  Weeks    Status  New    Target Date  12/19/19      PT LONG TERM GOAL #2   Title  Patient will achieve a 4+/5 Lt. knee strength in order to return to an active lifestyle    Baseline  Unable to assess    Time  12    Period  Weeks    Status  New     Target Date  12/19/19      PT LONG TERM GOAL #3   Title  Patient will demonstrate an normal gait pattern w/ no assistive device so that he is able to keep up with his children, per patient's stated goals    Baseline  Patient is using crutches, and is PWB with a step-to gait pattern    Time  12    Period  Weeks    Status  New    Target Date  12/19/19      PT LONG TERM GOAL #4   Title  Patient will be independent with all HEPs provided    Time  12    Period  Weeks    Status  New    Target Date  12/19/19            Plan - 10/10/19 3710    Clinical Impression Statement  Pt is doing very well at this point and I have encouraged him to work on endurance within allowed motions. We discussed thatt he is only 4 weeks out at this time and the graft still needs time to heel. tightness in quads due to fatigue, moderate edema with good patellar mobility.    PT Treatment/Interventions  ADLs/Self Care Home Management;Cryotherapy;Electrical Stimulation;Iontophoresis 4mg /ml Dexamethasone;Moist Heat;Traction;Ultrasound;Parrafin;Fluidtherapy;Gait training;Stair training;Functional mobility training;Therapeutic activities;Therapeutic exercise;Balance training;Neuromuscular re-education;Manual techniques;Patient/family education;Cognitive remediation;Compression bandaging;Scar mobilization;Passive range of motion;Dry needling;Energy conservation;Splinting;Taping;Vasopneumatic Device;Vestibular;Joint Manipulations    PT Next Visit Plan  continue challenging endurance of quads, heel raises    PT Home Exercise Plan     Consulted and Agree with Plan of Care  Patient       Patient will benefit from skilled therapeutic intervention in order to improve the following deficits  and impairments:  Abnormal gait, Decreased activity tolerance, Decreased balance, Decreased coordination, Decreased endurance, Decreased mobility, Decreased range of motion, Decreased scar mobility, Decreased strength, Difficulty  walking, Increased edema, Increased fascial restricitons, Hypomobility, Improper body mechanics, Pain  Visit Diagnosis: S/P ACL surgery  Muscle weakness (generalized)  Decreased range of motion of left knee     Problem List Patient Active Problem List   Diagnosis Date Noted  . Left anterior cruciate ligament tear 08/27/2019    Earnest Mcgillis C. Avalee Castrellon PT, DPT 10/10/19 9:31 AM   [facility] [address] [place], [place], [address] Phone: [phone]   Fax:  [phone]  Name: Peter Beck MRN: [id number] Date of Birth: 10/28/1985

## 2019-10-15 ENCOUNTER — Ambulatory Visit: Payer: Self-pay | Admitting: Physical Therapy

## 2019-10-17 ENCOUNTER — Other Ambulatory Visit: Payer: Self-pay

## 2019-10-17 ENCOUNTER — Encounter: Payer: Self-pay | Admitting: Physical Therapy

## 2019-10-17 ENCOUNTER — Ambulatory Visit: Payer: Self-pay | Admitting: Physical Therapy

## 2019-10-17 DIAGNOSIS — M25662 Stiffness of left knee, not elsewhere classified: Secondary | ICD-10-CM

## 2019-10-17 DIAGNOSIS — M6281 Muscle weakness (generalized): Secondary | ICD-10-CM

## 2019-10-17 DIAGNOSIS — Z9889 Other specified postprocedural states: Secondary | ICD-10-CM

## 2019-10-17 NOTE — Therapy (Signed)
[place], [place], [address] Phone: [phone]   Fax:  [phone]  Physical Therapy Treatment  Patient Details  Name: Peter Beck MRN: [id number] Date of Birth: 10/28/1985 Referring Provider (PT): Aundra Dubin, [place]   Encounter Date: 10/17/2019  PT End of Session - 10/17/19 0854    Visit Number  4    Number of Visits  25    Date for PT Re-Evaluation  12/19/19    Authorization Type  Self Pay    PT Start Time  6440    PT Stop Time  0937    PT Time Calculation (min)  43 min    Activity Tolerance  Patient tolerated treatment well    Behavior During Therapy  [facility] for tasks assessed/performed       Past Medical History:  Diagnosis Date  . Family history of adverse reaction to anesthesia    mom slow to wake up    Past Surgical History:  Procedure Laterality Date  . APPENDECTOMY    . KNEE ARTHROSCOPY WITH ANTERIOR CRUCIATE LIGAMENT (ACL) REPAIR Left 09/10/2019   Procedure: LEFT KNEE ARTHROSCOPY WITH ANTERIOR CRUCIATE LIGAMENT (ACL) REPAIR;  Surgeon: Leandrew Koyanagi, MD;  Location: [place];  Service: Orthopedics;  Laterality: Left;    There were no vitals filed for this visit.  Subjective Assessment - 10/17/19 0859    Subjective  Doing good, sometimes my knee gets stuck.    Currently in Pain?  No/denies         [facility] PT Assessment - 10/17/19 0001      Assessment   Medical Diagnosis  Rupture of anterior cruciate ligament of left knee, subsequent encounter    Referring Provider (PT)  Aundra Dubin, PA-C    Onset Date/Surgical Date  09/11/19      AROM   Left Knee Extension  0    Left Knee Flexion  90   limited by protocol at this time     Ambulation/Gait   Gait Comments  circumduction, lacking knee flexion in swing through                    [facility] Adult PT Treatment/Exercise - 10/17/19 0001      Knee/Hip Exercises: Stretches   Gastroc Stretch  Limitations  slant board      Knee/Hip Exercises: Aerobic   Stationary Bike  5 min, no resistance, limit flx to 90      Knee/Hip Exercises: Machines for Strengthening   Cybex Leg Press  horizontal leg press 1 plate   unable to tolerate due to leg pain     Knee/Hip Exercises: Standing   Heel Raises  Both;20 reps;10 reps    Gait Training  knee flexion in swing through      Manual Therapy   Joint Mobilization  Lt patella    Soft tissue mobilization  Lt VL & hamstrings               PT Short Term Goals - 09/22/19 1830      PT SHORT TERM GOAL #1   Title  Patient will be independent with initial HEP    Baseline  HEP provided 09/22/2019    Time  6    Period  Weeks    Status  New    Target Date  11/07/19      PT SHORT TERM GOAL #2   Title  Patient will be able to achieve 100  degrees of knee flexion to show improvements with Lt. Knee ROM    Baseline  45 degrees of Lt. Knee flexion    Time  6    Period  Weeks    Status  New    Target Date  11/07/19      PT SHORT TERM GOAL #3   Title  Patient will achieve 0 degrees of Lt. Knee Extension in order to demonstrate quad strength improvement    Baseline  Lt Knee Extension: -3    Time  6    Period  Weeks    Status  New    Target Date  11/07/19      PT SHORT TERM GOAL #4   Title  Patient will report a max 4/10 pain when bending is knee so that he is able to transfer in and out of his car with less difficulty    Baseline  Patient reports that pain gets as high as an 8/10 when getting in and out of his car    Time  6    Period  Weeks    Status  New    Target Date  11/07/19        PT Long Term Goals - 09/22/19 1840      PT LONG TERM GOAL #1   Title  Patient will be able to achieve 115 degrees of knee flexion so that he is able to ride a bike, per patient's stated goals    Baseline  45 degrees at eval    Time  12    Period  Weeks    Status  New    Target Date  12/19/19      PT LONG TERM GOAL #2   Title  Patient will  achieve a 4+/5 Lt. knee strength in order to return to an active lifestyle    Baseline  Unable to assess    Time  12    Period  Weeks    Status  New    Target Date  12/19/19      PT LONG TERM GOAL #3   Title  Patient will demonstrate an normal gait pattern w/ no assistive device so that he is able to keep up with his children, per patient's stated goals    Baseline  Patient is using crutches, and is PWB with a step-to gait pattern    Time  12    Period  Weeks    Status  New    Target Date  12/19/19      PT LONG TERM GOAL #4   Title  Patient will be independent with all HEPs provided    Time  12    Period  Weeks    Status  New    Target Date  12/19/19            Plan - 10/17/19 0859    Clinical Impression Statement  5 weeks out at this visit. tends to ambulate with knee extended and circumduction. worked on rolling over toe and avoiding torsion through knee joint. Catching was occuring in knee flexion- when observed he was moving into a valgus position when doing knee flexion and it was better when PT guided movement in a stright line. Significant tihtness in VL and hamstrings addressed with STM today. Asked him to incr HS stretching, be mindful of gait and keep knee in a straight line when flexing.    PT Treatment/Interventions  ADLs/Self Care Home Management;Cryotherapy;Electrical Stimulation;Iontophoresis 4mg /ml Dexamethasone;Moist Heat;Traction;Ultrasound;Parrafin;Fluidtherapy;Gait  training;Stair training;Functional mobility training;Therapeutic activities;Therapeutic exercise;Balance training;Neuromuscular re-education;Manual techniques;Patient/family education;Cognitive remediation;Compression bandaging;Scar mobilization;Passive range of motion;Dry needling;Energy conservation;Splinting;Taping;Vasopneumatic Device;Vestibular;Joint Manipulations    PT Next Visit Plan  check HS length- STM PRN, continue gait training    PT Home Exercise Plan  [id number]    Consulted and Agree with  Plan of Care  Patient       Patient will benefit from skilled therapeutic intervention in order to improve the following deficits and impairments:  Abnormal gait, Decreased activity tolerance, Decreased balance, Decreased coordination, Decreased endurance, Decreased mobility, Decreased range of motion, Decreased scar mobility, Decreased strength, Difficulty walking, Increased edema, Increased fascial restricitons, Hypomobility, Improper body mechanics, Pain  Visit Diagnosis: S/P ACL surgery  Muscle weakness (generalized)  Decreased range of motion of left knee     Problem List Patient Active Problem List   Diagnosis Date Noted  . Left anterior cruciate ligament tear 08/27/2019    Ervin Hensley C. Corbett Moulder PT, DPT 10/17/19 9:45 AM   [facility] [address] [place], [place], [address] Phone: [phone]   Fax:  [phone]  Name: Peter Beck MRN: [id number] Date of Birth: 10/28/1985

## 2019-10-22 ENCOUNTER — Ambulatory Visit: Payer: Self-pay | Admitting: Orthopaedic Surgery

## 2019-10-22 ENCOUNTER — Ambulatory Visit: Payer: Self-pay | Admitting: Physical Therapy

## 2019-10-24 ENCOUNTER — Telehealth: Payer: Self-pay | Admitting: Physical Therapy

## 2019-10-24 ENCOUNTER — Ambulatory Visit: Payer: Self-pay | Attending: Physician Assistant | Admitting: Physical Therapy

## 2019-10-24 DIAGNOSIS — M6281 Muscle weakness (generalized): Secondary | ICD-10-CM | POA: Insufficient documentation

## 2019-10-24 DIAGNOSIS — Z9889 Other specified postprocedural states: Secondary | ICD-10-CM | POA: Insufficient documentation

## 2019-10-24 DIAGNOSIS — M25662 Stiffness of left knee, not elsewhere classified: Secondary | ICD-10-CM | POA: Insufficient documentation

## 2019-10-24 NOTE — Telephone Encounter (Signed)
Spoke with patient via AMNAram Beecham [id number]. Pt reports he has cold symptoms, was out of [place] and overslept today. I advised that per our attendance policy that his next NS will result in cancellation of his appointments. Pt verbalized understanding.  Anaelle Dunton C. Quadasia Newsham PT, DPT 10/24/19 1:16 PM

## 2019-10-29 ENCOUNTER — Ambulatory Visit: Payer: Self-pay | Admitting: Physical Therapy

## 2019-10-29 ENCOUNTER — Encounter: Payer: Self-pay | Admitting: Physical Therapy

## 2019-10-29 ENCOUNTER — Ambulatory Visit (INDEPENDENT_AMBULATORY_CARE_PROVIDER_SITE_OTHER): Payer: Self-pay | Admitting: Orthopaedic Surgery

## 2019-10-29 ENCOUNTER — Other Ambulatory Visit: Payer: Self-pay

## 2019-10-29 ENCOUNTER — Encounter: Payer: Self-pay | Admitting: Orthopaedic Surgery

## 2019-10-29 VITALS — Ht 67.0 in | Wt 186.0 lb

## 2019-10-29 DIAGNOSIS — M25662 Stiffness of left knee, not elsewhere classified: Secondary | ICD-10-CM

## 2019-10-29 DIAGNOSIS — Z9889 Other specified postprocedural states: Secondary | ICD-10-CM

## 2019-10-29 DIAGNOSIS — M6281 Muscle weakness (generalized): Secondary | ICD-10-CM

## 2019-10-29 DIAGNOSIS — S83512D Sprain of anterior cruciate ligament of left knee, subsequent encounter: Secondary | ICD-10-CM

## 2019-10-29 NOTE — Progress Notes (Signed)
° °  Post-Op Visit Note   Patient: Peter Beck           Date of Birth: 10/28/1985           MRN: [id number] Visit Date: 10/29/2019 PCP: Patient, No Pcp Per   Assessment & Plan:  Chief Complaint:  Chief Complaint  Patient presents with   Left Knee - Follow-up    Left knee ACL reconstruction with patella tendon allograft DOS 09-17-2019   Visit Diagnoses:  1. Rupture of anterior cruciate ligament of left knee, subsequent encounter     Plan: Peter Beck is 6 weeks status post left ACL reconstruction.  He is progressing very well with physical therapy.  His strength and range of motion are improving well.  He is having some popping around the patella.  He also endorses a swelling.  He does not have any pain.  He has been very compliant with home exercises as well.  Surgical scars are fully healed.  He has a small joint effusion.  Range of motion is greater than 120 degrees with full extension.  At this point he will continue to work on quad strength and hamstring strength.  We performed aspiration and injection of the left knee today.  I would like to recheck him in 6 weeks.  I think is fine for him to use a stationary bike and walk on the treadmill as well as a stair climber.  Follow-Up Instructions: Return in about 6 weeks (around 12/10/2019).   Orders:  No orders of the defined types were placed in this encounter.  No orders of the defined types were placed in this encounter.   Imaging: No results found.  PMFS History: Patient Active Problem List   Diagnosis Date Noted   Left anterior cruciate ligament tear 08/27/2019   Past Medical History:  Diagnosis Date   Family history of adverse reaction to anesthesia    mom slow to wake up    No family history on file.  Past Surgical History:  Procedure Laterality Date   APPENDECTOMY     KNEE ARTHROSCOPY WITH ANTERIOR CRUCIATE LIGAMENT (ACL) REPAIR Left 09/10/2019   Procedure: LEFT KNEE ARTHROSCOPY WITH ANTERIOR CRUCIATE LIGAMENT  (ACL) REPAIR;  Surgeon: Tarry Kos, MD;  Location: [facility];  Service: Orthopedics;  Laterality: Left;   Social History   Occupational History   Not on file  Tobacco Use   Smoking status: Current Some Day Smoker   Smokeless tobacco: Never Used  Substance and Sexual Activity   Alcohol use: Yes    Comment: occasionally   Drug use: Never   Sexual activity: Not on file

## 2019-10-29 NOTE — Therapy (Signed)
[facility] [address] [place], [place], [address] Phone: [phone]   Fax:  [phone]  Physical Therapy Treatment  Patient Details  Name: Peter Beck MRN: [id number] Date of Birth: 10/28/1985 Referring Provider (PT): Cristie Hem, [place]   Encounter Date: 10/29/2019  PT End of Session - 10/29/19 1028    Visit Number  5    Number of Visits  25    Date for PT Re-Evaluation  12/19/19    Authorization Type  Self Pay    PT Start Time  1025   pt arrived late   PT Stop Time  1101    PT Time Calculation (min)  36 min    Activity Tolerance  Patient tolerated treatment well    Behavior During Therapy  [facility] for tasks assessed/performed       Past Medical History:  Diagnosis Date   Family history of adverse reaction to anesthesia    mom slow to wake up    Past Surgical History:  Procedure Laterality Date   APPENDECTOMY     KNEE ARTHROSCOPY WITH ANTERIOR CRUCIATE LIGAMENT (ACL) REPAIR Left 09/10/2019   Procedure: LEFT KNEE ARTHROSCOPY WITH ANTERIOR CRUCIATE LIGAMENT (ACL) REPAIR;  Surgeon: Tarry Kos, MD;  Location: [facility];  Service: Orthopedics;  Laterality: Left;    There were no vitals filed for this visit.  Subjective Assessment - 10/29/19 1029    Subjective  Knee has been doing good. No problems. The STM from last session was very helpful.    Patient Stated Goals  "I want to be normal"    Currently in Pain?  No/denies                        [facility] Adult PT Treatment/Exercise - 10/29/19 0001      Knee/Hip Exercises: Aerobic   Stationary Bike  5 min L3      Knee/Hip Exercises: Standing   Heel Raises  Both;20 reps;10 reps    Terminal Knee Extension  Left;20 reps;Theraband    Theraband Level (Terminal Knee Extension)  Level 4 (Blue)    Abduction Limitations  yellow tband standing at counter    Extension Limitations  yellow tband- elbows on counter    Functional Squat  Limitations  pull from sink      Manual Therapy   Soft tissue mobilization  scar mobs Lt knee               PT Short Term Goals - 09/22/19 1830      PT SHORT TERM GOAL #1   Title  Patient will be independent with initial HEP    Baseline  HEP provided 09/22/2019    Time  6    Period  Weeks    Status  New    Target Date  11/07/19      PT SHORT TERM GOAL #2   Title  Patient will be able to achieve 100 degrees of knee flexion to show improvements with Lt. Knee ROM    Baseline  45 degrees of Lt. Knee flexion    Time  6    Period  Weeks    Status  New    Target Date  11/07/19      PT SHORT TERM GOAL #3   Title  Patient will achieve 0 degrees of Lt. Knee Extension in order to demonstrate quad strength improvement    Baseline  Lt Knee Extension: -3  Time  6    Period  Weeks    Status  New    Target Date  11/07/19      PT SHORT TERM GOAL #4   Title  Patient will report a max 4/10 pain when bending is knee so that he is able to transfer in and out of his car with less difficulty    Baseline  Patient reports that pain gets as high as an 8/10 when getting in and out of his car    Time  6    Period  Weeks    Status  New    Target Date  11/07/19        PT Long Term Goals - 09/22/19 1840      PT LONG TERM GOAL #1   Title  Patient will be able to achieve 115 degrees of knee flexion so that he is able to ride a bike, per patient's stated goals    Baseline  45 degrees at eval    Time  12    Period  Weeks    Status  New    Target Date  12/19/19      PT LONG TERM GOAL #2   Title  Patient will achieve a 4+/5 Lt. knee strength in order to return to an active lifestyle    Baseline  Unable to assess    Time  12    Period  Weeks    Status  New    Target Date  12/19/19      PT LONG TERM GOAL #3   Title  Patient will demonstrate an normal gait pattern w/ no assistive device so that he is able to keep up with his children, per patient's stated goals    Baseline  Patient is  using crutches, and is PWB with a step-to gait pattern    Time  12    Period  Weeks    Status  New    Target Date  12/19/19      PT LONG TERM GOAL #4   Title  Patient will be independent with all HEPs provided    Time  12    Period  Weeks    Status  New    Target Date  12/19/19            Plan - 10/29/19 1103    Clinical Impression Statement  Pt demo gait pattern [facility] today without pain. Strength exercises progressed appropraite for week 7 post op. Well tolerated with some fatigue. Is nervous about returning to sport and fears something is wrong since he cannot run. educated during manual therapy the importance of healing ACL before pushing it too hard and timeline for return to sport. Popping noted in [place] knee flx/ext but not worrisome, likely scar tissue.    PT Treatment/Interventions  ADLs/Self Care Home Management;Cryotherapy;Electrical Stimulation;Iontophoresis 4mg /ml Dexamethasone;Moist Heat;Traction;Ultrasound;Parrafin;Fluidtherapy;Gait training;Stair training;Functional mobility training;Therapeutic activities;Therapeutic exercise;Balance training;Neuromuscular re-education;Manual techniques;Patient/family education;Cognitive remediation;Compression bandaging;Scar mobilization;Passive range of motion;Dry needling;Energy conservation;Splinting;Taping;Vasopneumatic Device;Vestibular;Joint Manipulations    PT Next Visit Plan  obj measures, HS curls, wall slides to 45 deg    PT Home Exercise Plan  [id number]    Consulted and Agree with Plan of Care  Patient       Patient will benefit from skilled therapeutic intervention in order to improve the following deficits and impairments:  Abnormal gait, Decreased activity tolerance, Decreased balance, Decreased coordination, Decreased endurance, Decreased mobility, Decreased range of motion, Decreased scar mobility, Decreased strength, Difficulty walking,  Increased edema, Increased fascial restricitons, Hypomobility, Improper body mechanics,  Pain  Visit Diagnosis: S/P ACL surgery  Muscle weakness (generalized)  Decreased range of motion of left knee     Problem List Patient Active Problem List   Diagnosis Date Noted   Left anterior cruciate ligament tear 08/27/2019    Aime Carreras C. Kelia Gibbon PT, DPT 10/29/19 11:06 AM   [facility] [address] [place], [place], [address] Phone: [phone]   Fax:  [phone]  Name: Peter Beck MRN: [id number] Date of Birth: 10/28/1985

## 2019-10-31 ENCOUNTER — Encounter: Payer: Self-pay | Admitting: Physical Therapy

## 2019-10-31 ENCOUNTER — Other Ambulatory Visit: Payer: Self-pay

## 2019-10-31 ENCOUNTER — Ambulatory Visit: Payer: Self-pay | Admitting: Physical Therapy

## 2019-10-31 DIAGNOSIS — M6281 Muscle weakness (generalized): Secondary | ICD-10-CM

## 2019-10-31 DIAGNOSIS — Z9889 Other specified postprocedural states: Secondary | ICD-10-CM

## 2019-10-31 DIAGNOSIS — M25662 Stiffness of left knee, not elsewhere classified: Secondary | ICD-10-CM

## 2019-10-31 NOTE — Therapy (Signed)
[place], [place], [address] Phone: [phone]   Fax:  [phone]  Physical Therapy Treatment  Patient Details  Name: Peter Beck MRN: [id number] Date of Birth: 10/28/1985 Referring Provider (PT): Aundra Dubin, [place]   Encounter Date: 10/31/2019   PT End of Session - 10/31/19 0847    Visit Number 6    Number of Visits 25    Date for PT Re-Evaluation 12/19/19    Authorization Type Self Pay    PT Start Time 0845    PT Stop Time 0924    PT Time Calculation (min) 39 min    Activity Tolerance Patient tolerated treatment well    Behavior During Therapy [facility] for tasks assessed/performed           Past Medical History:  Diagnosis Date  . Family history of adverse reaction to anesthesia    mom slow to wake up    Past Surgical History:  Procedure Laterality Date  . APPENDECTOMY    . KNEE ARTHROSCOPY WITH ANTERIOR CRUCIATE LIGAMENT (ACL) REPAIR Left 09/10/2019   Procedure: LEFT KNEE ARTHROSCOPY WITH ANTERIOR CRUCIATE LIGAMENT (ACL) REPAIR;  Surgeon: Leandrew Koyanagi, MD;  Location: [facility];  Service: Orthopedics;  Laterality: Left;    There were no vitals filed for this visit.   Subjective Assessment - 10/31/19 0849    Subjective I went to Dr Erlinda Hong and he drained some fluid. Scar tissue mobs helped.              [facility] PT Assessment - 10/31/19 0001      ROM / Strength   AROM / PROM / Strength Strength      AROM   Left Knee Extension 0    Left Knee Flexion 120   actively without pain     Strength   Overall Strength Comments hip gross 5/5    Strength Assessment Site Knee    Right/Left Knee Left    Left Knee Flexion 4+/5    Left Knee Extension 4-/5                         [facility] Adult PT Treatment/Exercise - 10/31/19 0001      Knee/Hip Exercises: Stretches   Passive Hamstring Stretch Both;2 reps;20 seconds    Passive Hamstring Stretch Limitations supine  with strap      Knee/Hip Exercises: Standing   Terminal Knee Extension Left;15 reps   5s holds   Terminal Knee Extension Limitations ball against wall    Lateral Step Up Left;15 reps    Wall Squat 10 reps;5 reps    Wall Squat Limitations limit to 45 deg per protocol    SLS 5s holds      Knee/Hip Exercises: Supine   Straight Leg Raises 20 reps    Straight Leg Raises Limitations avoid touching table bw reps    Straight Leg Raise with External Rotation Left;20 reps    Straight Leg Raise with External Rotation Limitations avoid touching table bw reps      Knee/Hip Exercises: Prone   Hamstring Curl 20 reps    Hamstring Curl Limitations 2lb slow ext      Manual Therapy   Soft tissue mobilization scar mobs, roller quads & adductors                    PT Short Term Goals - 09/22/19 1830      PT SHORT  TERM GOAL #1   Title Patient will be independent with initial HEP    Baseline HEP provided 09/22/2019    Time 6    Period Weeks    Status New    Target Date 11/07/19      PT SHORT TERM GOAL #2   Title Patient will be able to achieve 100 degrees of knee flexion to show improvements with Lt. Knee ROM    Baseline 45 degrees of Lt. Knee flexion    Time 6    Period Weeks    Status New    Target Date 11/07/19      PT SHORT TERM GOAL #3   Title Patient will achieve 0 degrees of Lt. Knee Extension in order to demonstrate quad strength improvement    Baseline Lt Knee Extension: -3    Time 6    Period Weeks    Status New    Target Date 11/07/19      PT SHORT TERM GOAL #4   Title Patient will report a max 4/10 pain when bending is knee so that he is able to transfer in and out of his car with less difficulty    Baseline Patient reports that pain gets as high as an 8/10 when getting in and out of his car    Time 6    Period Weeks    Status New    Target Date 11/07/19             PT Long Term Goals - 09/22/19 1840      PT LONG TERM GOAL #1   Title Patient will be  able to achieve 115 degrees of knee flexion so that he is able to ride a bike, per patient's stated goals    Baseline 45 degrees at eval    Time 12    Period Weeks    Status New    Target Date 12/19/19      PT LONG TERM GOAL #2   Title Patient will achieve a 4+/5 Lt. knee strength in order to return to an active lifestyle    Baseline Unable to assess    Time 12    Period Weeks    Status New    Target Date 12/19/19      PT LONG TERM GOAL #3   Title Patient will demonstrate an normal gait pattern w/ no assistive device so that he is able to keep up with his children, per patient's stated goals    Baseline Patient is using crutches, and is PWB with a step-to gait pattern    Time 12    Period Weeks    Status New    Target Date 12/19/19      PT LONG TERM GOAL #4   Title Patient will be independent with all HEPs provided    Time 12    Period Weeks    Status New    Target Date 12/19/19                 Plan - 10/31/19 1031    Clinical Impression Statement good tolerance to endurance challenges with good strength proximally. Fatigued and was stopped when form was lost. ROM is beyond goals at this point and restrictions per protocol were reinforced again today.    PT Treatment/Interventions ADLs/Self Care Home Management;Cryotherapy;Electrical Stimulation;Iontophoresis 4mg /ml Dexamethasone;Moist Heat;Traction;Ultrasound;Parrafin;Fluidtherapy;Gait training;Stair training;Functional mobility training;Therapeutic activities;Therapeutic exercise;Balance training;Neuromuscular re-education;Manual techniques;Patient/family education;Cognitive remediation;Compression bandaging;Scar mobilization;Passive range of motion;Dry needling;Energy conservation;Splinting;Taping;Vasopneumatic Device;Vestibular;Joint Manipulations    PT  Next Visit Plan continue balance work    PT Home Exercise Plan [id number]    Consulted and Agree with Plan of Care Patient           Patient will benefit from  skilled therapeutic intervention in order to improve the following deficits and impairments:  Abnormal gait, Decreased activity tolerance, Decreased balance, Decreased coordination, Decreased endurance, Decreased mobility, Decreased range of motion, Decreased scar mobility, Decreased strength, Difficulty walking, Increased edema, Increased fascial restricitons, Hypomobility, Improper body mechanics, Pain  Visit Diagnosis: S/P ACL surgery  Muscle weakness (generalized)  Decreased range of motion of left knee     Problem List Patient Active Problem List   Diagnosis Date Noted  . Left anterior cruciate ligament tear 08/27/2019    Christianjames Soule C. Arien Benincasa PT, DPT 10/31/19 10:34 AM   [facility] [address] [place], [place], [address] Phone: [phone]   Fax:  [phone]  Name: Peter Beck MRN: [id number] Date of Birth: 10/28/1985

## 2019-11-05 ENCOUNTER — Ambulatory Visit: Payer: Self-pay | Admitting: Physical Therapy

## 2019-11-07 ENCOUNTER — Other Ambulatory Visit: Payer: Self-pay

## 2019-11-07 ENCOUNTER — Encounter: Payer: Self-pay | Admitting: Physical Therapy

## 2019-11-07 ENCOUNTER — Ambulatory Visit: Payer: Self-pay | Admitting: Physical Therapy

## 2019-11-07 DIAGNOSIS — M6281 Muscle weakness (generalized): Secondary | ICD-10-CM

## 2019-11-07 DIAGNOSIS — Z9889 Other specified postprocedural states: Secondary | ICD-10-CM

## 2019-11-07 DIAGNOSIS — M25662 Stiffness of left knee, not elsewhere classified: Secondary | ICD-10-CM

## 2019-11-07 NOTE — Therapy (Signed)
[place], [place], [address] Phone: [phone]   Fax:  [phone]  Physical Therapy Treatment  Patient Details  Name: Peter Beck MRN: [id number] Date of Birth: 10/28/1985 Referring Provider (PT): Aundra Dubin, [place]   Encounter Date: 11/07/2019   PT End of Session - 11/07/19 0858    Visit Number 7    Number of Visits 25    Date for PT Re-Evaluation 12/19/19    Authorization Type Self Pay    PT Start Time 9678   pt. arrived late   PT Stop Time 0928    PT Time Calculation (min) 32 min    Activity Tolerance Patient tolerated treatment well    Behavior During Therapy [facility] for tasks assessed/performed           Past Medical History:  Diagnosis Date  . Family history of adverse reaction to anesthesia    mom slow to wake up    Past Surgical History:  Procedure Laterality Date  . APPENDECTOMY    . KNEE ARTHROSCOPY WITH ANTERIOR CRUCIATE LIGAMENT (ACL) REPAIR Left 09/10/2019   Procedure: LEFT KNEE ARTHROSCOPY WITH ANTERIOR CRUCIATE LIGAMENT (ACL) REPAIR;  Surgeon: Leandrew Koyanagi, MD;  Location: [facility];  Service: Orthopedics;  Laterality: Left;    There were no vitals filed for this visit.   Subjective Assessment - 11/07/19 0859    Subjective Pt. reports knee feels good. No pain pre-tx. and no new complaints/concerns otherwise this AM.    Patient is accompained by: Interpreter   Fernanda#[id number]   Pain Score 0-No pain                             [facility] Adult PT Treatment/Exercise - 11/07/19 0001      Knee/Hip Exercises: Aerobic   Recumbent Bike L3 x 5 min      Knee/Hip Exercises: Standing   Terminal Knee Extension AROM;Strengthening;Left;2 sets;10 reps    Theraband Level (Terminal Knee Extension) Level 4 (Blue)    Hip Abduction AROM;Stengthening;Right;Left;20 reps    Abduction Limitations blue Theraband proximal to knees    Lateral Step Up Left;20  reps;Step Height: 4"    Lateral Step Up Limitations focus eccentric control with lateral step down LLE on step    Forward Step Up Left;20 reps;Step Height: 6"    Wall Squat 20 reps    Wall Squat Limitations limit to 45 deg per protocol    Rocker Board 2 minutes    Rocker Board Limitations dynamic balance lateral and fw/rev x 1 min ea.    SLS 5 reps x 5-10 sec holds on Airex    Rebounder Left SLS with right toe touch on Airex 1000g ball x 30      Knee/Hip Exercises: Supine   Bridges AROM;Strengthening;Both;15 reps    Bridges Limitations legs over reversed incline wedge    Straight Leg Raises 20 reps    Straight Leg Raises Limitations avoid touching table bw reps    Straight Leg Raise with External Rotation Left;20 reps    Straight Leg Raise with External Rotation Limitations avoid touching table bw reps                    PT Short Term Goals - 09/22/19 1830      PT SHORT TERM GOAL #1   Title Patient will be independent with initial HEP    Baseline HEP provided 09/22/2019  Time 6    Period Weeks    Status New    Target Date 11/07/19      PT SHORT TERM GOAL #2   Title Patient will be able to achieve 100 degrees of knee flexion to show improvements with Lt. Knee ROM    Baseline 45 degrees of Lt. Knee flexion    Time 6    Period Weeks    Status New    Target Date 11/07/19      PT SHORT TERM GOAL #3   Title Patient will achieve 0 degrees of Lt. Knee Extension in order to demonstrate quad strength improvement    Baseline Lt Knee Extension: -3    Time 6    Period Weeks    Status New    Target Date 11/07/19      PT SHORT TERM GOAL #4   Title Patient will report a max 4/10 pain when bending is knee so that he is able to transfer in and out of his car with less difficulty    Baseline Patient reports that pain gets as high as an 8/10 when getting in and out of his car    Time 6    Period Weeks    Status New    Target Date 11/07/19             PT Long Term  Goals - 09/22/19 1840      PT LONG TERM GOAL #1   Title Patient will be able to achieve 115 degrees of knee flexion so that he is able to ride a bike, per patient's stated goals    Baseline 45 degrees at eval    Time 12    Period Weeks    Status New    Target Date 12/19/19      PT LONG TERM GOAL #2   Title Patient will achieve a 4+/5 Lt. knee strength in order to return to an active lifestyle    Baseline Unable to assess    Time 12    Period Weeks    Status New    Target Date 12/19/19      PT LONG TERM GOAL #3   Title Patient will demonstrate an normal gait pattern w/ no assistive device so that he is able to keep up with his children, per patient's stated goals    Baseline Patient is using crutches, and is PWB with a step-to gait pattern    Time 12    Period Weeks    Status New    Target Date 12/19/19      PT LONG TERM GOAL #4   Title Patient will be independent with all HEPs provided    Time 12    Period Weeks    Status New    Target Date 12/19/19                 Plan - 11/07/19 0913    Clinical Impression Statement Continued work on balance and progression of quad strengthening per protocol with closed chain activities. Still with quad weakness expected for timeframe post-op with decreased eccentric control for stepdown motions but overall continues to improve from previous status. Decreased proprioception evident with balance challenges on compliant surfaces but improving with this as well.    Examination-Activity Limitations Bend;Squat;Stairs;Stand;Transfers;Locomotion Level    Examination-Participation Restrictions Cleaning;Community Activity;Yard Work;Other    Stability/Clinical Decision Making Stable/Uncomplicated    Clinical Decision Making Low    Rehab Potential Good    PT  Frequency 2x / week    PT Duration 12 weeks    PT Treatment/Interventions ADLs/Self Care Home Management;Cryotherapy;Electrical Stimulation;Iontophoresis 4mg /ml Dexamethasone;Moist  Heat;Traction;Ultrasound;Parrafin;Fluidtherapy;Gait training;Stair training;Functional mobility training;Therapeutic activities;Therapeutic exercise;Balance training;Neuromuscular re-education;Manual techniques;Patient/family education;Cognitive remediation;Compression bandaging;Scar mobilization;Passive range of motion;Dry needling;Energy conservation;Splinting;Taping;Vasopneumatic Device;Vestibular;Joint Manipulations    PT Next Visit Plan continue balance work    PT Home Exercise Plan    Consulted and Agree with Plan of Care Patient           Patient will benefit from skilled therapeutic intervention in order to improve the following deficits and impairments:  Abnormal gait, Decreased activity tolerance, Decreased balance, Decreased coordination, Decreased endurance, Decreased mobility, Decreased range of motion, Decreased scar mobility, Decreased strength, Difficulty walking, Increased edema, Increased fascial restricitons, Hypomobility, Improper body mechanics, Pain  Visit Diagnosis: S/P ACL surgery  Muscle weakness (generalized)  Decreased range of motion of left knee     Problem List Patient Active Problem List   Diagnosis Date Noted  . Left anterior cruciate ligament tear 08/27/2019    10/27/2019, PT, DPT 11/07/19 9:30 AM  [facility] [address] [place], [place], [place] Phone: [phone]   Fax:  [phone]  Name: Peter Beck MRN: Helaine Chess Date of Birth: 10/28/1985

## 2019-11-12 ENCOUNTER — Ambulatory Visit: Payer: Self-pay | Admitting: Physical Therapy

## 2019-11-14 ENCOUNTER — Other Ambulatory Visit: Payer: Self-pay

## 2019-11-14 ENCOUNTER — Encounter: Payer: Self-pay | Admitting: Physical Therapy

## 2019-11-14 ENCOUNTER — Ambulatory Visit: Payer: Self-pay | Admitting: Physical Therapy

## 2019-11-14 DIAGNOSIS — Z9889 Other specified postprocedural states: Secondary | ICD-10-CM

## 2019-11-14 DIAGNOSIS — M6281 Muscle weakness (generalized): Secondary | ICD-10-CM

## 2019-11-14 DIAGNOSIS — M25662 Stiffness of left knee, not elsewhere classified: Secondary | ICD-10-CM

## 2019-11-14 NOTE — Therapy (Signed)
Peter Beck, [place], [address] Phone: [phone]   Fax:  [phone]  Physical Therapy Treatment  Patient Details  Name: Peter Beck MRN: [id number] Date of Birth: 10/28/1985 Referring Provider (PT): Peter Beck, [place]   Encounter Date: 11/14/2019   PT End of Session - 11/14/19 0903    Visit Number 8    Number of Visits 25    Date for PT Re-Evaluation 12/19/19    Authorization Type Self Pay    PT Start Time 0900   pt arrived late   PT Stop Time 0930    PT Time Calculation (min) 30 min    Activity Tolerance Patient tolerated treatment well    Behavior During Therapy [name] for tasks assessed/performed           Past Medical History:  Diagnosis Date  . Family history of adverse reaction to anesthesia    mom slow to wake up    Past Surgical History:  Procedure Laterality Date  . APPENDECTOMY    . KNEE ARTHROSCOPY WITH ANTERIOR CRUCIATE LIGAMENT (ACL) REPAIR Left 09/10/2019   Procedure: LEFT KNEE ARTHROSCOPY WITH ANTERIOR CRUCIATE LIGAMENT (ACL) REPAIR;  Surgeon: Leandrew Koyanagi, MD;  Location: [place];  Service: Orthopedics;  Laterality: Left;    There were no vitals filed for this visit.   Subjective Assessment - 11/14/19 0903    Subjective Doing very well    Patient is accompained by: Interpreter   Levada Dy [id number]                            [name] Adult PT Treatment/Exercise - 11/14/19 0001      Knee/Hip Exercises: Standing   Heel Raises 20 reps;10 reps    Heel Raises Limitations feet turned out    Lateral Step Up Left;20 reps;Step Height: 4"   gentle finger tips on counter   SLS SLS green therapad 5x20s, golfer hinge reach to counter      Knee/Hip Exercises: Supine   Other Supine Knee/Hip Exercises squats at counter                    PT Short Term Goals - 09/22/19 1830      PT SHORT TERM GOAL #1   Title Patient will be independent with  initial HEP    Baseline HEP provided 09/22/2019    Time 6    Period Weeks    Status New    Target Date 11/07/19      PT SHORT TERM GOAL #2   Title Patient will be able to achieve 100 degrees of knee flexion to show improvements with Lt. Knee ROM    Baseline 45 degrees of Lt. Knee flexion    Time 6    Period Weeks    Status New    Target Date 11/07/19      PT SHORT TERM GOAL #3   Title Patient will achieve 0 degrees of Lt. Knee Extension in order to demonstrate quad strength improvement    Baseline Lt Knee Extension: -3    Time 6    Period Weeks    Status New    Target Date 11/07/19      PT SHORT TERM GOAL #4   Title Patient will report a max 4/10 pain when bending is knee so that he is able to transfer in and out of his car with less difficulty  Baseline Patient reports that pain gets as high as an 8/10 when getting in and out of his car    Time 6    Period Weeks    Status New    Target Date 11/07/19             PT Long Term Goals - 09/22/19 1840      PT LONG TERM GOAL #1   Title Patient will be able to achieve 115 degrees of knee flexion so that he is able to ride a bike, per patient's stated goals    Baseline 45 degrees at eval    Time 12    Period Weeks    Status New    Target Date 12/19/19      PT LONG TERM GOAL #2   Title Patient will achieve a 4+/5 Lt. knee strength in order to return to an active lifestyle    Baseline Unable to assess    Time 12    Period Weeks    Status New    Target Date 12/19/19      PT LONG TERM GOAL #3   Title Patient will demonstrate an normal gait pattern w/ no assistive device so that he is able to keep up with his children, per patient's stated goals    Baseline Patient is using crutches, and is PWB with a step-to gait pattern    Time 12    Period Weeks    Status New    Target Date 12/19/19      PT LONG TERM GOAL #4   Title Patient will be independent with all HEPs provided    Time 12    Period Weeks    Status New     Target Date 12/19/19                 Plan - 11/14/19 2878    Clinical Impression Statement shortened session today due to pt arriving late but he was fatigued by exercises. He reports that his HEP is not too difficult so it was progressed again. still needs further training in dynamic balance in SLS.    PT Treatment/Interventions ADLs/Self Care Home Management;Cryotherapy;Electrical Stimulation;Iontophoresis 4mg /ml Dexamethasone;Moist Heat;Traction;Ultrasound;Parrafin;Fluidtherapy;Gait training;Stair training;Functional mobility training;Therapeutic activities;Therapeutic exercise;Balance training;Neuromuscular re-education;Manual techniques;Patient/family education;Cognitive remediation;Compression bandaging;Scar mobilization;Passive range of motion;Dry needling;Energy conservation;Splinting;Taping;Vasopneumatic Device;Vestibular;Joint Manipulations    PT Next Visit Plan continue CKC & balance training, update STGs    PT Home Exercise Plan    Consulted and Agree with Plan of Care Patient           Patient will benefit from skilled therapeutic intervention in order to improve the following deficits and impairments:  Abnormal gait, Decreased activity tolerance, Decreased balance, Decreased coordination, Decreased endurance, Decreased mobility, Decreased range of motion, Decreased scar mobility, Decreased strength, Difficulty walking, Increased edema, Increased fascial restricitons, Hypomobility, Improper body mechanics, Pain  Visit Diagnosis: S/P ACL surgery  Muscle weakness (generalized)  Decreased range of motion of left knee     Problem List Patient Active Problem List   Diagnosis Date Noted  . Left anterior cruciate ligament tear 08/27/2019    [name] PT, DPT 11/14/19 10:20 AM   [name] [address] [place], [place], [place] Phone: [phone]   Fax:  [phone]  Name: Peter Beck MRN: Helaine Chess Date of Birth: 10/28/1985

## 2019-11-19 ENCOUNTER — Ambulatory Visit: Payer: Self-pay | Admitting: Physical Therapy

## 2019-11-21 ENCOUNTER — Ambulatory Visit: Payer: Self-pay | Attending: Physician Assistant | Admitting: Physical Therapy

## 2019-11-21 ENCOUNTER — Encounter: Payer: Self-pay | Admitting: Physical Therapy

## 2019-11-21 ENCOUNTER — Other Ambulatory Visit: Payer: Self-pay

## 2019-11-21 DIAGNOSIS — Z9889 Other specified postprocedural states: Secondary | ICD-10-CM

## 2019-11-21 DIAGNOSIS — M6281 Muscle weakness (generalized): Secondary | ICD-10-CM

## 2019-11-21 DIAGNOSIS — M25662 Stiffness of left knee, not elsewhere classified: Secondary | ICD-10-CM

## 2019-11-21 NOTE — Therapy (Signed)
[facility] [address] [place], [place], [address] Phone: [phone]   Fax:  [phone]  Physical Therapy Treatment  Patient Details  Name: Peter Beck MRN: [id number] Date of Birth: 10/28/1985 Referring Provider (PT): Peter Beck, [place]   Encounter Date: 11/21/2019   PT End of Session - 11/21/19 0904    Visit Number 9    Number of Visits 25    Date for PT Re-Evaluation 12/19/19    Authorization Type Self Pay    PT Start Time 0900    PT Stop Time 0935    PT Time Calculation (min) 35 min    Activity Tolerance Patient tolerated treatment well    Behavior During Therapy [facility] for tasks assessed/performed           Past Medical History:  Diagnosis Date  . Family history of adverse reaction to anesthesia    mom slow to wake up    Past Surgical History:  Procedure Laterality Date  . APPENDECTOMY    . KNEE ARTHROSCOPY WITH ANTERIOR CRUCIATE LIGAMENT (ACL) REPAIR Left 09/10/2019   Procedure: LEFT KNEE ARTHROSCOPY WITH ANTERIOR CRUCIATE LIGAMENT (ACL) REPAIR;  Surgeon: Peter Kos, MD;  Location: [facility];  Service: Orthopedics;  Laterality: Left;    There were no vitals filed for this visit.                      [facility] Adult PT Treatment/Exercise - 11/21/19 0001      Knee/Hip Exercises: Stretches   Gastroc Stretch Both;2 reps;30 seconds    Gastroc Stretch Limitations slant board      Knee/Hip Exercises: Aerobic   Elliptical 5 min L1 ramp 10      Knee/Hip Exercises: Standing   SLS SLS ABCs green physioball      Knee/Hip Exercises: Seated   Stool Scoot - Round Trips single leg 2x50 ft      Knee/Hip Exercises: Supine   Engineer, production Limitations x10 both legs yellow around knees, x10 each single leg      Knee/Hip Exercises: Sidelying   Hip ABduction Both;15 reps    Hip ABduction Limitations abd+flx/ext, yellow tband below knees                   PT Education - 11/21/19 0941    Education Details STGs, discussed that he is consistently late for appointments and requested that he try to be on time    Person(s) Educated Patient    Methods Explanation    Comprehension Verbalized understanding;Need further instruction            PT Short Term Goals - 11/21/19 0910      PT SHORT TERM GOAL #1   Title Patient will be independent with initial HEP    Status Achieved      PT SHORT TERM GOAL #2   Title Patient will be able to achieve 100 degrees of knee flexion to show improvements with Lt. Knee ROM    Baseline 0-136    Status Achieved      PT SHORT TERM GOAL #3   Title Patient will achieve 0 degrees of Lt. Knee Extension in order to demonstrate quad strength improvement    Status Achieved      PT SHORT TERM GOAL #4   Title Patient will report a max 4/10 pain when bending is knee so that he is able to transfer in and out of  his car with less difficulty    Baseline just a little, I need to be careful but it does not hurt that much    Status Achieved             PT Long Term Goals - 09/22/19 1840      PT LONG TERM GOAL #1   Title Patient will be able to achieve 115 degrees of knee flexion so that he is able to ride a bike, per patient's stated goals    Baseline 45 degrees at eval    Time 12    Period Weeks    Status New    Target Date 12/19/19      PT LONG TERM GOAL #2   Title Patient will achieve a 4+/5 Lt. knee strength in order to return to an active lifestyle    Baseline Unable to assess    Time 12    Period Weeks    Status New    Target Date 12/19/19      PT LONG TERM GOAL #3   Title Patient will demonstrate an normal gait pattern w/ no assistive device so that he is able to keep up with his children, per patient's stated goals    Baseline Patient is using crutches, and is PWB with a step-to gait pattern    Time 12    Period Weeks    Status New    Target Date 12/19/19      PT LONG TERM  GOAL #4   Title Patient will be independent with all HEPs provided    Time 12    Period Weeks    Status New    Target Date 12/19/19                 Plan - 11/21/19 0941    Clinical Impression Statement Pt is now 10 weeks post op and is ready for progression of exercises. Continues to fatigue with challenges but is able to maintain good form. Able to hold static single leg balance but is unstable with movement of upper body. Will continue to challenge stability in balance and progress stress to ACL.    PT Treatment/Interventions ADLs/Self Care Home Management;Cryotherapy;Electrical Stimulation;Iontophoresis 4mg /ml Dexamethasone;Moist Heat;Traction;Ultrasound;Parrafin;Fluidtherapy;Gait training;Stair training;Functional mobility training;Therapeutic activities;Therapeutic exercise;Balance training;Neuromuscular re-education;Manual techniques;Patient/family education;Cognitive remediation;Compression bandaging;Scar mobilization;Passive range of motion;Dry needling;Energy conservation;Splinting;Taping;Vasopneumatic Device;Vestibular;Joint Manipulations    PT Next Visit Plan balance on unstable surface, progress squat depth.    PT Home Exercise Plan    Consulted and Agree with Plan of Care Patient           Patient will benefit from skilled therapeutic intervention in order to improve the following deficits and impairments:  Abnormal gait, Decreased activity tolerance, Decreased balance, Decreased coordination, Decreased endurance, Decreased mobility, Decreased range of motion, Decreased scar mobility, Decreased strength, Difficulty walking, Increased edema, Increased fascial restricitons, Hypomobility, Improper body mechanics, Pain  Visit Diagnosis: S/P ACL surgery  Muscle weakness (generalized)  Decreased range of motion of left knee     Problem List Patient Active Problem List   Diagnosis Date Noted  . Left anterior cruciate ligament tear 08/27/2019    Peter Beck C.  Peter Beck PT, DPT 11/21/19 9:44 AM   [facility] [address] [place], [place], [place] Phone: [phone]   Fax:  [phone]  Name: Peter Beck MRN: Peter Beck Date of Birth: 10/28/1985

## 2019-11-26 ENCOUNTER — Ambulatory Visit: Payer: Self-pay | Admitting: Physical Therapy

## 2019-11-26 ENCOUNTER — Encounter: Payer: Self-pay | Admitting: Physical Therapy

## 2019-11-26 ENCOUNTER — Other Ambulatory Visit: Payer: Self-pay

## 2019-11-26 DIAGNOSIS — Z9889 Other specified postprocedural states: Secondary | ICD-10-CM

## 2019-11-26 DIAGNOSIS — M25662 Stiffness of left knee, not elsewhere classified: Secondary | ICD-10-CM

## 2019-11-26 DIAGNOSIS — M6281 Muscle weakness (generalized): Secondary | ICD-10-CM

## 2019-11-26 NOTE — Therapy (Addendum)
[place], [place], [address] Phone: [phone]   Fax:  [phone]  Physical Therapy Treatment/Discharge  Patient Details  Name: Peter Beck MRN: [id number] Date of Birth: 10/28/1985 Referring Provider (PT): Aundra Dubin, [place]   Encounter Date: 11/26/2019   PT End of Session - 11/26/19 1028    Visit Number 10    Number of Visits 25    Date for PT Re-Evaluation 12/19/19    Authorization Type Self Pay    PT Start Time 9379   pt arrived late   PT Stop Time 1101    PT Time Calculation (min) 38 min    Activity Tolerance Patient tolerated treatment well    Behavior During Therapy [facility] for tasks assessed/performed           Past Medical History:  Diagnosis Date  . Family history of adverse reaction to anesthesia    mom slow to wake up    Past Surgical History:  Procedure Laterality Date  . APPENDECTOMY    . KNEE ARTHROSCOPY WITH ANTERIOR CRUCIATE LIGAMENT (ACL) REPAIR Left 09/10/2019   Procedure: LEFT KNEE ARTHROSCOPY WITH ANTERIOR CRUCIATE LIGAMENT (ACL) REPAIR;  Surgeon: Leandrew Koyanagi, MD;  Location: [place];  Service: Orthopedics;  Laterality: Left;    There were no vitals filed for this visit.   Subjective Assessment - 11/26/19 1026    Subjective Knee is really good. It doesn't really get tired, it starts hurting a little when I do a lot of activities.    Patient is accompained by: Interpreter   Wynetta Fines [phone]   Currently in Pain? No/denies              [facility] PT Assessment - 11/26/19 0001      Strength   Left Knee Flexion 5/5    Left Knee Extension 5/5      Ambulation/Gait   Gait Comments gait pattern [facility]                         [facility] Adult PT Treatment/Exercise - 11/26/19 0001      Knee/Hip Exercises: Stretches   Passive Hamstring Stretch Both;30 seconds    Passive Hamstring Stretch Limitations supine with strap    Knee: Self-Stretch  Limitations prone quad stretch      Knee/Hip Exercises: Aerobic   Recumbent Bike 5 min L8      Knee/Hip Exercises: Standing   SLS with hinge & alt UE taps    SLS with Vectors SLS with chest press green tband    Other Standing Knee Exercises TRX: squats, lunges, t-hinge with UE horiz abd, single leg heel raises x30 each      Knee/Hip Exercises: Prone   Hamstring Curl 20 reps    Hamstring Curl Limitations 3lb    Hip Extension Left;Limitations    Hip Extension Limitations 3lb knee flexed                    PT Short Term Goals - 11/21/19 0910      PT SHORT TERM GOAL #1   Title Patient will be independent with initial HEP    Status Achieved      PT SHORT TERM GOAL #2   Title Patient will be able to achieve 100 degrees of knee flexion to show improvements with Lt. Knee ROM    Baseline 0-136    Status Achieved      PT SHORT  TERM GOAL #3   Title Patient will achieve 0 degrees of Lt. Knee Extension in order to demonstrate quad strength improvement    Status Achieved      PT SHORT TERM GOAL #4   Title Patient will report a max 4/10 pain when bending is knee so that he is able to transfer in and out of his car with less difficulty    Baseline just a little, I need to be careful but it does not hurt that much    Status Achieved             PT Long Term Goals - 09/22/19 1840      PT LONG TERM GOAL #1   Title Patient will be able to achieve 115 degrees of knee flexion so that he is able to ride a bike, per patient's stated goals    Baseline 45 degrees at eval    Time 12    Period Weeks    Status New    Target Date 12/19/19      PT LONG TERM GOAL #2   Title Patient will achieve a 4+/5 Lt. knee strength in order to return to an active lifestyle    Baseline Unable to assess    Time 12    Period Weeks    Status New    Target Date 12/19/19      PT LONG TERM GOAL #3   Title Patient will demonstrate an normal gait pattern w/ no assistive device so that he is able to  keep up with his children, per patient's stated goals    Baseline Patient is using crutches, and is PWB with a step-to gait pattern    Time 12    Period Weeks    Status New    Target Date 12/19/19      PT LONG TERM GOAL #4   Title Patient will be independent with all HEPs provided    Time 12    Period Weeks    Status New    Target Date 12/19/19                 Plan - 11/26/19 1101    Clinical Impression Statement Continues to demo improved control in single leg balance challenges and good form in increased strengthening. Fatigeus but deneis any pain. Knee flexion ROM is WFL but quads are tight in prone stretching.    PT Treatment/Interventions ADLs/Self Care Home Management;Cryotherapy;Electrical Stimulation;Iontophoresis 28m/ml Dexamethasone;Moist Heat;Traction;Ultrasound;Parrafin;Fluidtherapy;Gait training;Stair training;Functional mobility training;Therapeutic activities;Therapeutic exercise;Balance training;Neuromuscular re-education;Manual techniques;Patient/family education;Cognitive remediation;Compression bandaging;Scar mobilization;Passive range of motion;Dry needling;Energy conservation;Splinting;Taping;Vasopneumatic Device;Vestibular;Joint Manipulations    PT Next Visit Plan balance unstable surfaces, wall squat endurance holds    PT Home Exercise Plan [id number]   Consulted and Agree with Plan of Care Patient           Patient will benefit from skilled therapeutic intervention in order to improve the following deficits and impairments:  Abnormal gait, Decreased activity tolerance, Decreased balance, Decreased coordination, Decreased endurance, Decreased mobility, Decreased range of motion, Decreased scar mobility, Decreased strength, Difficulty walking, Increased edema, Increased fascial restricitons, Hypomobility, Improper body mechanics, Pain  Visit Diagnosis: S/P ACL surgery  Muscle weakness (generalized)  Decreased range of motion of left knee     Problem  List Patient Active Problem List   Diagnosis Date Noted  . Left anterior cruciate ligament tear 08/27/2019    Marlia Schewe C. Calin Fantroy PT, DPT 11/26/19 11:03 AM    [facility] [address] [place] [address]  [place], [place], [address] Phone: [phone]   Fax:  [phone]  Name: Peter Beck MRN: [id number] Date of Birth: 10/28/1985  PHYSICAL THERAPY DISCHARGE SUMMARY  Visits from Start of Care: 10  Current functional level related to goals / functional outcomes: See above   Remaining deficits: See above   Education / Equipment: Anatomy of condition, POC, HEP, exercise form/rationale  Plan: Patient agrees to discharge.  Patient goals were partially met. Patient is being discharged due to not returning since the last visit.  ?????     Jaylissa Felty C. Crissa Sowder PT, DPT 12/25/19 10:13 AM

## 2019-11-28 ENCOUNTER — Ambulatory Visit: Payer: Self-pay | Admitting: Physical Therapy

## 2019-11-28 ENCOUNTER — Telehealth: Payer: Self-pay | Admitting: Physical Therapy

## 2019-11-28 NOTE — Telephone Encounter (Signed)
LVM via Vilinda Blanks [id number], advising that he missed his appointment today and reminded him of his next appointment.  Damon Baisch C. Tony Friscia PT, DPT 11/28/19 11:01 AM

## 2019-12-03 ENCOUNTER — Ambulatory Visit: Payer: Self-pay | Admitting: Physical Therapy

## 2019-12-04 ENCOUNTER — Telehealth: Payer: Self-pay | Admitting: Physical Therapy

## 2019-12-04 NOTE — Telephone Encounter (Signed)
LVM via interpreter that this is his 2nd no show in a row. Per our attendance policy, I will leave the next appointment on the schedule and the rest will be cancelled. He will be able to schedule 1 visit at a time following.  Kyliah Deanda C. Coline Calkin PT, DPT 12/04/19 11:55 AM

## 2019-12-05 ENCOUNTER — Ambulatory Visit: Payer: Self-pay | Admitting: Physical Therapy

## 2019-12-10 ENCOUNTER — Ambulatory Visit: Payer: Self-pay | Admitting: Orthopaedic Surgery

## 2019-12-10 ENCOUNTER — Ambulatory Visit: Payer: Self-pay | Admitting: Physical Therapy

## 2019-12-12 ENCOUNTER — Ambulatory Visit: Payer: Self-pay | Admitting: Physical Therapy

## 2019-12-17 ENCOUNTER — Encounter: Payer: Self-pay | Admitting: Physical Therapy

## 2019-12-19 ENCOUNTER — Encounter: Payer: Self-pay | Admitting: Physical Therapy

## 2019-12-19 ENCOUNTER — Encounter: Payer: Self-pay | Admitting: Orthopaedic Surgery

## 2019-12-19 ENCOUNTER — Ambulatory Visit (INDEPENDENT_AMBULATORY_CARE_PROVIDER_SITE_OTHER): Payer: Self-pay | Admitting: Orthopaedic Surgery

## 2019-12-19 DIAGNOSIS — S83512D Sprain of anterior cruciate ligament of left knee, subsequent encounter: Secondary | ICD-10-CM

## 2019-12-19 MED ORDER — BUPIVACAINE HCL 0.5 % IJ SOLN
2.0000 mL | INTRAMUSCULAR | Status: AC | PRN
  Administered 2019-12-19: 2 mL via INTRA_ARTICULAR

## 2019-12-19 MED ORDER — TRAMADOL HCL 50 MG PO TABS: 50.0000 mg | ORAL_TABLET | Freq: Every day | ORAL | 0 refills | Status: AC | PRN

## 2019-12-19 MED ORDER — LIDOCAINE HCL 1 % IJ SOLN
2.0000 mL | INTRAMUSCULAR | Status: AC | PRN
  Administered 2019-12-19: 2 mL

## 2019-12-19 MED ORDER — METHYLPREDNISOLONE ACETATE 40 MG/ML IJ SUSP
40.0000 mg | INTRAMUSCULAR | Status: AC | PRN
  Administered 2019-12-19: 40 mg via INTRA_ARTICULAR

## 2019-12-19 NOTE — Progress Notes (Signed)
   Post-Op Visit Note   Patient: Peter Beck           Date of Birth: 10/28/1985           MRN: [id number] Visit Date: 12/19/2019 PCP: Patient, No Pcp Per   Assessment & Plan:  Chief Complaint:  Chief Complaint  Patient presents with  . Left Knee - Pain   Visit Diagnoses:  1. Rupture of anterior cruciate ligament of left knee, subsequent encounter     Plan: Peter Beck is 75-month status post left ACL reconstruction.  He went back to work last week.  He has noticed some swelling in his left knee.  The swelling has caused discomfort and pain.  Overall he is doing well.  He is not reporting any instability.  His surgical scars are fully healed.  He has a small joint effusion which we aspirated and then injected with cortisone.  He will continue with home exercises.  Activity restrictions were reviewed in detail.  Follow-up in 3 months for recheck.   Procedure Note  Patient: Peter Beck             Date of Birth: 10/28/1985           MRN: [id number]             Visit Date: 12/19/2019  Procedures: Visit Diagnoses:  1. Rupture of anterior cruciate ligament of left knee, subsequent encounter     Large Joint Inj: L knee on 12/19/2019 2:15 PM Details: 22 G needle Medications: 2 mL bupivacaine 0.5 %; 2 mL lidocaine 1 %; 40 mg methylPREDNISolone acetate 40 MG/ML Aspirate: 20 mL blood-tinged Outcome: tolerated well, no immediate complications Patient was prepped and draped in the usual sterile fashion.        Follow-Up Instructions: Return in about 3 months (around 03/20/2020).   Orders:  No orders of the defined types were placed in this encounter.  Meds ordered this encounter  Medications  . traMADol (ULTRAM) 50 MG tablet    Sig: Take 1-2 tablets (50-100 mg total) by mouth daily as needed.    Dispense:  20 tablet    Refill:  0    Imaging: No results found.  PMFS History: Patient Active Problem List   Diagnosis Date Noted  . Left anterior cruciate ligament tear  08/27/2019   Past Medical History:  Diagnosis Date  . Family history of adverse reaction to anesthesia    mom slow to wake up    History reviewed. No pertinent family history.  Past Surgical History:  Procedure Laterality Date  . APPENDECTOMY    . KNEE ARTHROSCOPY WITH ANTERIOR CRUCIATE LIGAMENT (ACL) REPAIR Left 09/10/2019   Procedure: LEFT KNEE ARTHROSCOPY WITH ANTERIOR CRUCIATE LIGAMENT (ACL) REPAIR;  Surgeon: Tarry Kos, MD;  Location: [facility];  Service: Orthopedics;  Laterality: Left;   Social History   Occupational History  . Not on file  Tobacco Use  . Smoking status: Current Some Day Smoker  . Smokeless tobacco: Never Used  Substance and Sexual Activity  . Alcohol use: Yes    Comment: occasionally  . Drug use: Never  . Sexual activity: Not on file

## 2020-01-19 ENCOUNTER — Telehealth: Payer: Self-pay | Admitting: Orthopaedic Surgery

## 2020-01-19 NOTE — Telephone Encounter (Signed)
Pt called stating he was cleared to go back to work but he isn't feeling well now and would like a note excusing him from work until 01/21/20 emailed to him  [email or web address]

## 2020-01-19 NOTE — Telephone Encounter (Signed)
yes

## 2020-01-21 NOTE — Telephone Encounter (Signed)
Emailed note

## 2020-02-09 ENCOUNTER — Telehealth: Payer: Self-pay

## 2020-02-09 NOTE — Telephone Encounter (Signed)
Pt called stating he was cleared to go back to work but he isn't feeling well now and would like a note excusing him from work until next appointment which is 03/23/2020 emailed to him he stated he needs the note before Wednesday 02/11/2020  [email or web address] Call back:[phone]

## 2020-02-09 NOTE — Telephone Encounter (Signed)
yes

## 2020-02-11 NOTE — Telephone Encounter (Signed)
Done

## 2020-03-23 ENCOUNTER — Ambulatory Visit: Payer: Self-pay | Admitting: Orthopaedic Surgery

## 2020-06-16 ENCOUNTER — Ambulatory Visit: Payer: Self-pay | Admitting: Orthopaedic Surgery

## 2020-06-22 ENCOUNTER — Ambulatory Visit: Payer: Self-pay | Admitting: Orthopaedic Surgery

## 2020-06-29 ENCOUNTER — Ambulatory Visit: Payer: Self-pay | Admitting: Orthopaedic Surgery

## 2020-07-02 ENCOUNTER — Ambulatory Visit (INDEPENDENT_AMBULATORY_CARE_PROVIDER_SITE_OTHER): Payer: Self-pay | Admitting: Orthopaedic Surgery

## 2020-07-02 ENCOUNTER — Ambulatory Visit (INDEPENDENT_AMBULATORY_CARE_PROVIDER_SITE_OTHER): Payer: Self-pay

## 2020-07-02 ENCOUNTER — Encounter: Payer: Self-pay | Admitting: Orthopaedic Surgery

## 2020-07-02 VITALS — Ht 67.0 in | Wt 186.0 lb

## 2020-07-02 DIAGNOSIS — S83512D Sprain of anterior cruciate ligament of left knee, subsequent encounter: Secondary | ICD-10-CM

## 2020-07-02 NOTE — Progress Notes (Signed)
   Office Visit Note   Patient: Peter Beck           Date of Birth: [birth year]           MRN: [id number] Visit Date: 07/02/2020              Requested by: No referring provider defined for this encounter. PCP: Patient, No Pcp Per   Assessment & Plan: Visit Diagnoses:  1. Rupture of anterior cruciate ligament of left knee, subsequent encounter     Plan: Impression is 10 months status post left ACL reconstruction.  He reports some residual pain and instability.  I believe that this is related to weakness in his hamstrings and quadriceps.  I have recommended a knee brace for support as well as getting back into physical therapy.  Work note was provided to remain out of work until reevaluation in 6 weeks.  Light duty is not available.  Follow-Up Instructions: Return in about 6 weeks (around 08/13/2020).   Orders:  Orders Placed This Encounter  Procedures  . XR Knee 1-2 Views Left  . Ambulatory referral to Physical Therapy   No orders of the defined types were placed in this encounter.     Procedures: No procedures performed   Clinical Data: No additional findings.   Subjective: Chief Complaint  Patient presents with  . Left Knee - Pain    Ronen is approximately 10 months status post left ACL reconstruction.  He states that he feels some grinding has pain with weightbearing unstable.  Has returned back to work and feels a little bit of swelling at the end of the day.  He works in Systems [employer].   Review of Systems   Objective: Vital Signs: Ht 5\' 7"  (1.702 m)   Wt 186 lb (84.4 kg)   BMI 29.13 kg/m   Physical Exam  Ortho Exam Left knee shows fully healed surgical scars.  He has normal range of motion without significant pain.  Graft feels stable with solid endpoint.  Negative pivot shift.  Trace effusion.  Collaterals are stable.  Specialty Comments:  No specialty comments available.  Imaging: XR Knee 1-2 Views Left  Result Date: 07/02/2020 Stable femoral  button.  No acute or structural abnormalities.    PMFS History: Patient Active Problem List   Diagnosis Date Noted  . Left anterior cruciate ligament tear 08/27/2019   Past Medical History:  Diagnosis Date  . Family history of adverse reaction to anesthesia    mom slow to wake up    History reviewed. No pertinent family history.  Past Surgical History:  Procedure Laterality Date  . APPENDECTOMY    . KNEE ARTHROSCOPY WITH ANTERIOR CRUCIATE LIGAMENT (ACL) REPAIR Left 09/10/2019   Procedure: LEFT KNEE ARTHROSCOPY WITH ANTERIOR CRUCIATE LIGAMENT (ACL) REPAIR;  Surgeon: 09/12/2019, MD;  Location: [facility];  Service: Orthopedics;  Laterality: Left;   Social History   Occupational History  . Not on file  Tobacco Use  . Smoking status: Current Some Day Smoker  . Smokeless tobacco: Never Used  Substance and Sexual Activity  . Alcohol use: Yes    Comment: occasionally  . Drug use: Never  . Sexual activity: Not on file

## 2020-07-15 ENCOUNTER — Telehealth: Payer: Self-pay | Admitting: Orthopaedic Surgery

## 2020-07-15 NOTE — Telephone Encounter (Signed)
Received call from [facility] w/ [facility] [employer] [employer], employers atty,stating she hasn't received any records. I advised we haven't received their request, that their is no correspondence with them. Advised need to send request and will process. She stated mediation is next week and needs as soon as possible. Ph [phone]

## 2021-07-25 IMAGING — DX DG KNEE COMPLETE 4+V*L*
4 series · 4 of 4 positions shown · non-contrast
Comparison: None.

CLINICAL DATA: Initial evaluation for acute trauma, fall.

EXAM:
LEFT KNEE - COMPLETE 4+ VIEW

[knee ap]
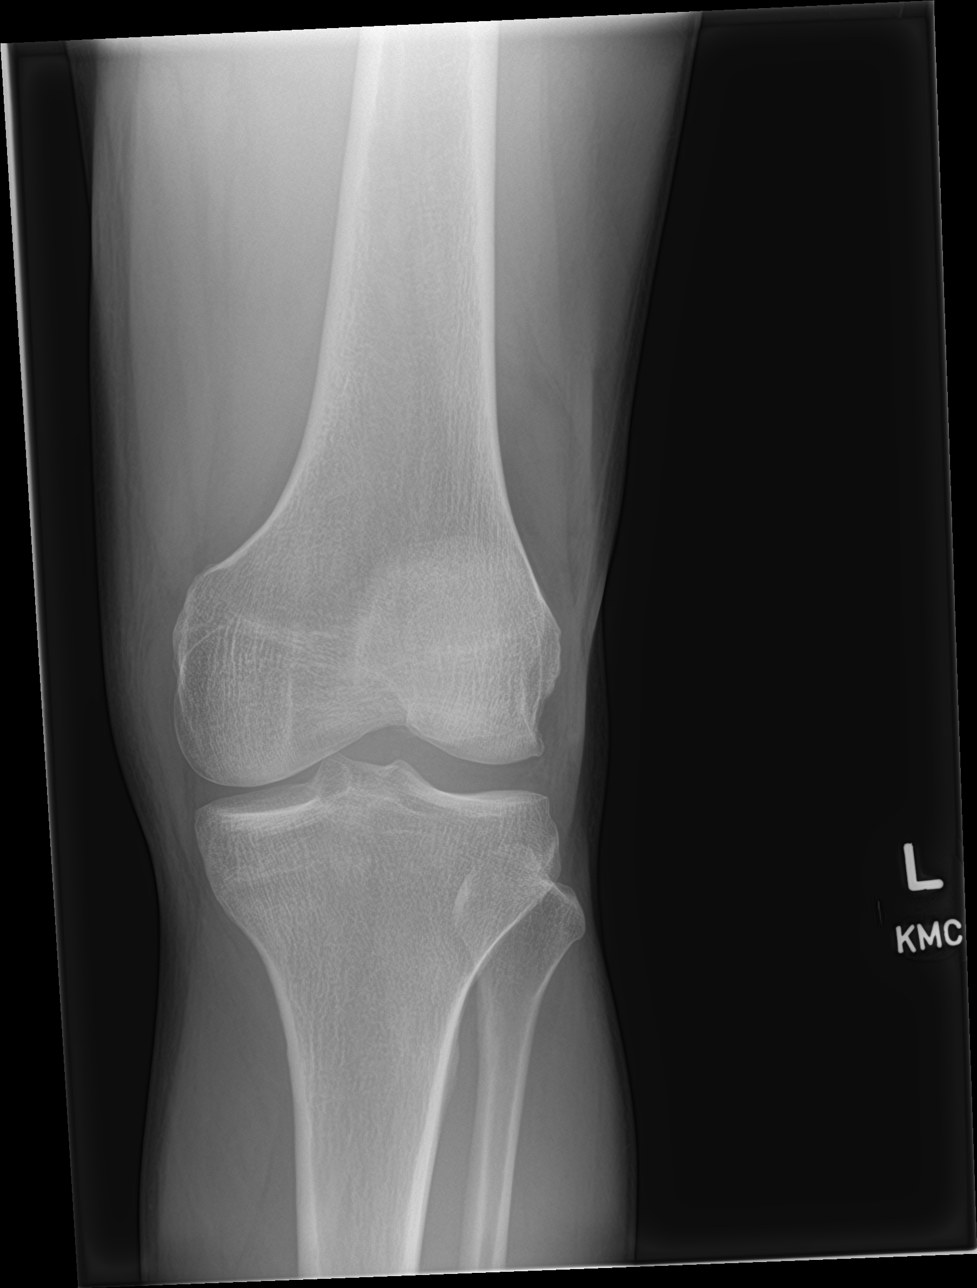

[knee lat]
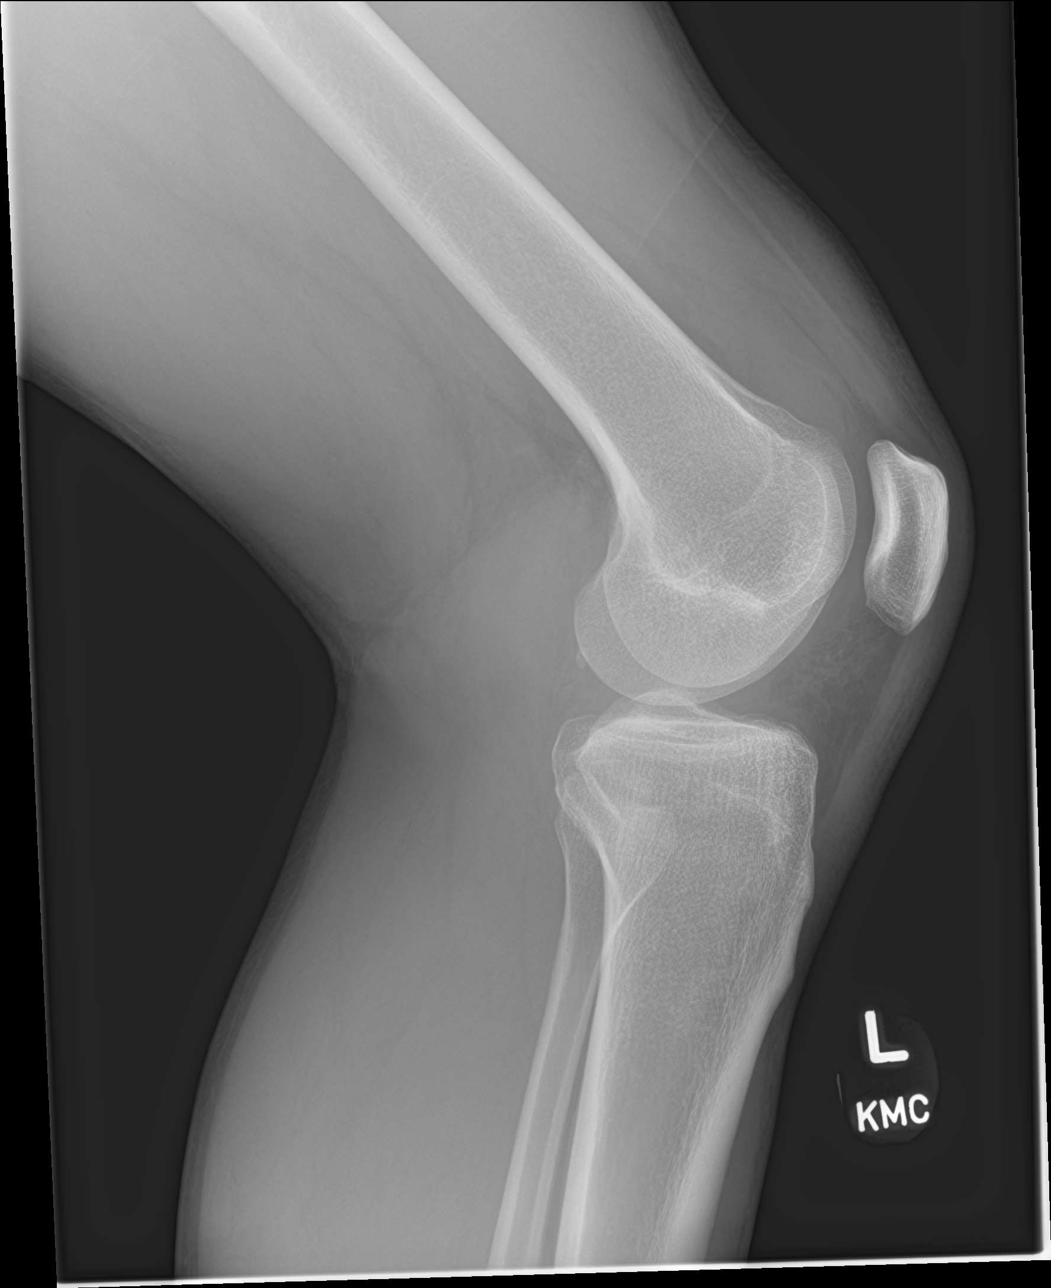

[knee obl (1 of 2)]
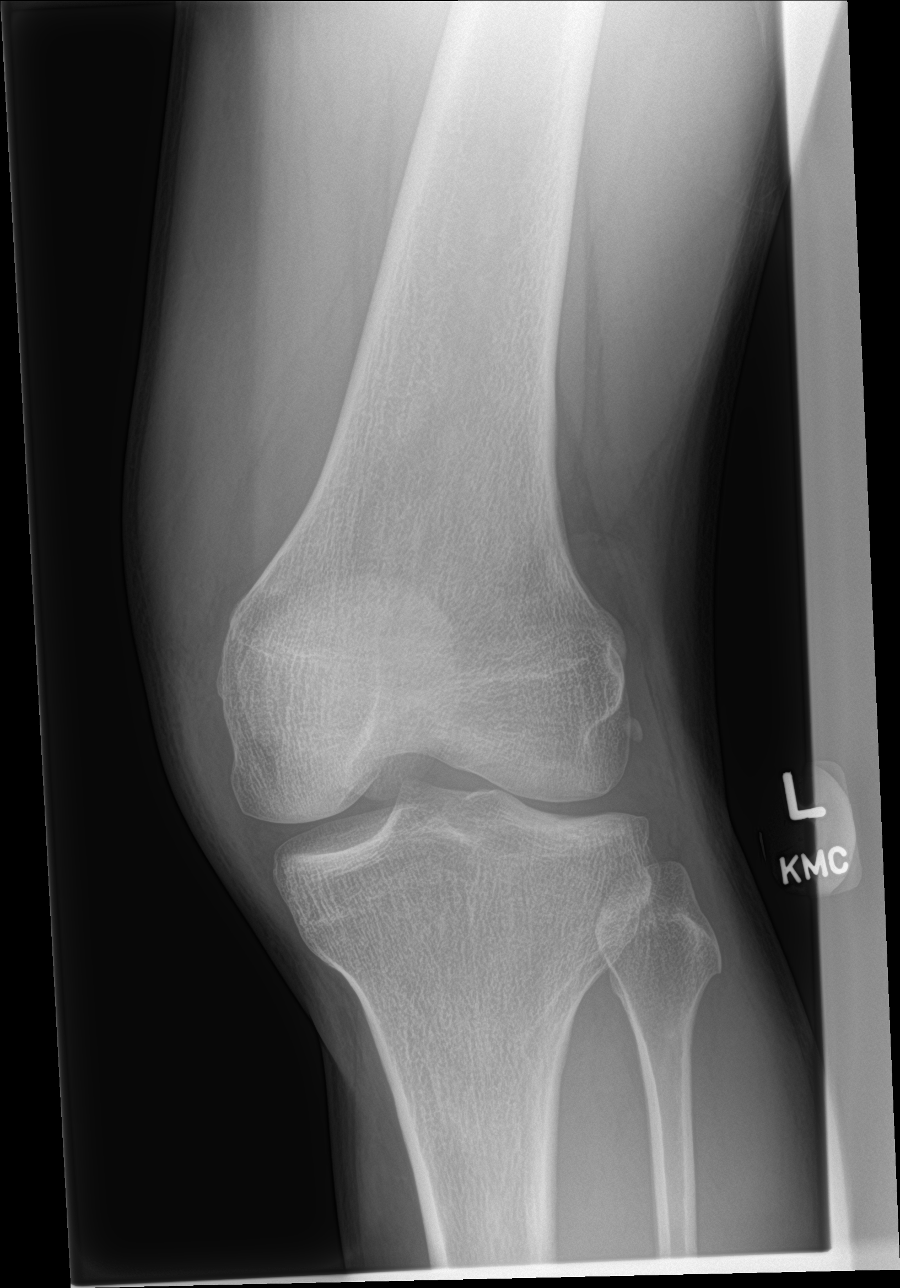

[knee obl (2 of 2)]
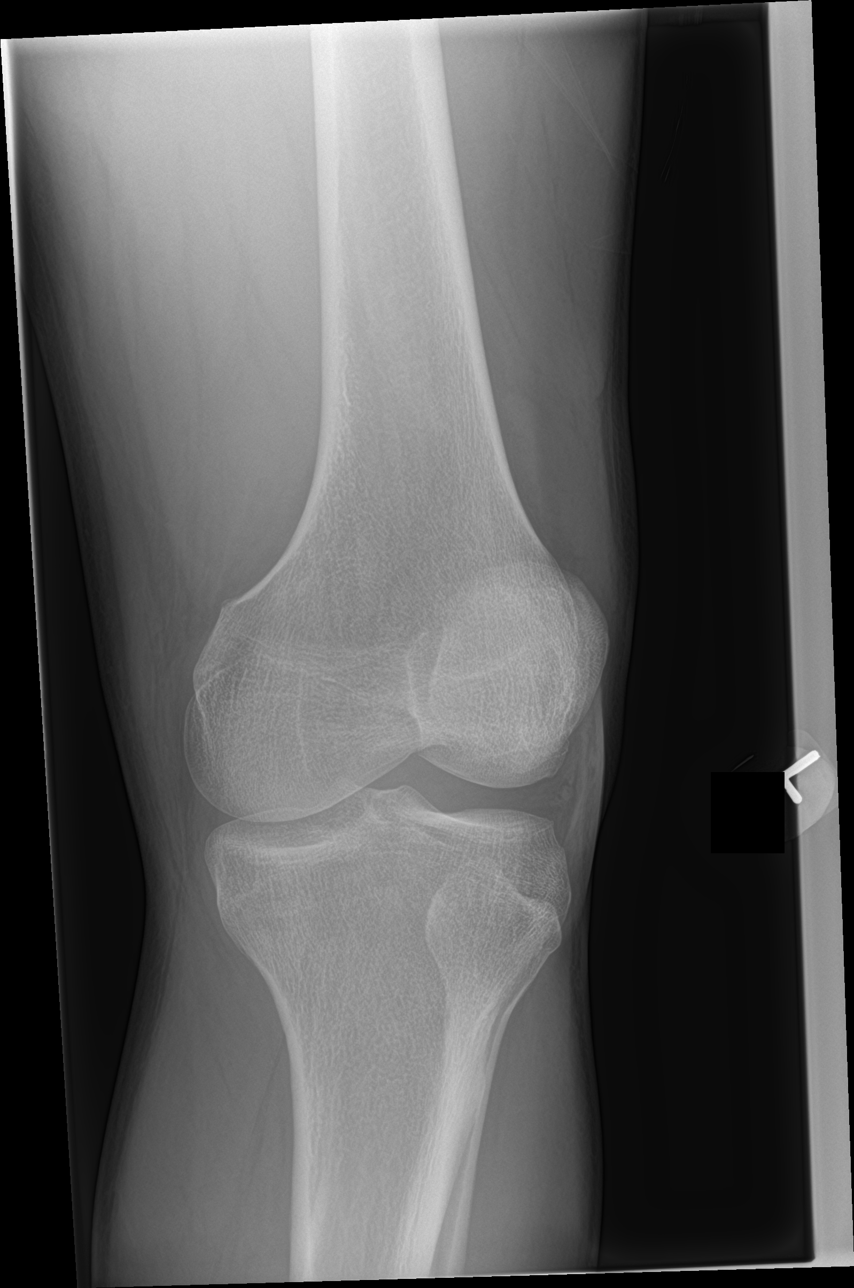

[4 of 4 positions shown; findings below may reference images not displayed]

FINDINGS: No acute fracture dislocation. Small joint effusion noted within the
suprapatellar recess. Mild/early osteoarthritic changes present
about the knee. Osseous mineralization normal. No soft tissue
injury.
IMPRESSION: 1. No acute osseous abnormality.
2. Small joint effusion within the suprapatellar recess.
